# Patient Record
Sex: Female | Born: 1950
Health system: Southern US, Community
[De-identification: ages and names within clinical notes are randomized; demographics above are authoritative.]

## PROBLEM LIST (undated history)

## (undated) DIAGNOSIS — T8859XA Other complications of anesthesia, initial encounter: Secondary | ICD-10-CM

## (undated) DIAGNOSIS — C4492 Squamous cell carcinoma of skin, unspecified: Secondary | ICD-10-CM

## (undated) DIAGNOSIS — I82409 Acute embolism and thrombosis of unspecified deep veins of unspecified lower extremity: Secondary | ICD-10-CM

## (undated) DIAGNOSIS — M8580 Other specified disorders of bone density and structure, unspecified site: Secondary | ICD-10-CM

## (undated) DIAGNOSIS — R1900 Intra-abdominal and pelvic swelling, mass and lump, unspecified site: Secondary | ICD-10-CM

## (undated) DIAGNOSIS — I871 Compression of vein: Secondary | ICD-10-CM

## (undated) DIAGNOSIS — R112 Nausea with vomiting, unspecified: Secondary | ICD-10-CM

## (undated) DIAGNOSIS — I10 Essential (primary) hypertension: Secondary | ICD-10-CM

## (undated) DIAGNOSIS — M1990 Unspecified osteoarthritis, unspecified site: Secondary | ICD-10-CM

## (undated) DIAGNOSIS — E785 Hyperlipidemia, unspecified: Secondary | ICD-10-CM

## (undated) DIAGNOSIS — K219 Gastro-esophageal reflux disease without esophagitis: Secondary | ICD-10-CM

## (undated) HISTORY — DX: Squamous cell carcinoma of skin, unspecified: C44.92

## (undated) HISTORY — PX: BREAST ENHANCEMENT SURGERY: SHX7

## (undated) HISTORY — DX: Hyperlipidemia, unspecified: E78.5

## (undated) HISTORY — PX: BREAST BIOPSY: SHX20

## (undated) HISTORY — PX: AUGMENTATION MAMMAPLASTY: SUR837

## (undated) HISTORY — DX: Compression of vein: I87.1

## (undated) HISTORY — DX: Gastro-esophageal reflux disease without esophagitis: K21.9

## (undated) HISTORY — DX: Acute embolism and thrombosis of unspecified deep veins of unspecified lower extremity: I82.409

## (undated) HISTORY — DX: Other specified disorders of bone density and structure, unspecified site: M85.80

## (undated) HISTORY — PX: BREAST SURGERY: SHX581

## (undated) HISTORY — DX: Intra-abdominal and pelvic swelling, mass and lump, unspecified site: R19.00

---

## 1997-10-12 ENCOUNTER — Other Ambulatory Visit: Admission: RE | Admit: 1997-10-12 | Discharge: 1997-10-12 | Payer: Self-pay | Admitting: Obstetrics and Gynecology

## 1997-11-08 ENCOUNTER — Other Ambulatory Visit: Admission: RE | Admit: 1997-11-08 | Discharge: 1997-11-08 | Payer: Self-pay | Admitting: Obstetrics and Gynecology

## 1998-06-24 HISTORY — PX: LEEP: SHX91

## 1999-02-28 ENCOUNTER — Other Ambulatory Visit: Admission: RE | Admit: 1999-02-28 | Discharge: 1999-02-28 | Payer: Self-pay | Admitting: Obstetrics and Gynecology

## 1999-03-01 ENCOUNTER — Other Ambulatory Visit: Admission: RE | Admit: 1999-03-01 | Discharge: 1999-03-01 | Payer: Self-pay | Admitting: Obstetrics and Gynecology

## 1999-03-01 ENCOUNTER — Encounter (INDEPENDENT_AMBULATORY_CARE_PROVIDER_SITE_OTHER): Payer: Self-pay

## 2000-07-16 ENCOUNTER — Other Ambulatory Visit: Admission: RE | Admit: 2000-07-16 | Discharge: 2000-07-16 | Payer: Self-pay | Admitting: Obstetrics and Gynecology

## 2000-07-30 ENCOUNTER — Encounter: Payer: Self-pay | Admitting: Obstetrics and Gynecology

## 2000-07-30 ENCOUNTER — Encounter: Admission: RE | Admit: 2000-07-30 | Discharge: 2000-07-30 | Payer: Self-pay | Admitting: Obstetrics and Gynecology

## 2000-10-15 ENCOUNTER — Encounter: Admission: RE | Admit: 2000-10-15 | Discharge: 2000-10-15 | Payer: Self-pay | Admitting: Obstetrics and Gynecology

## 2000-10-15 ENCOUNTER — Encounter: Payer: Self-pay | Admitting: Obstetrics and Gynecology

## 2001-03-16 ENCOUNTER — Encounter: Payer: Self-pay | Admitting: Obstetrics and Gynecology

## 2001-03-16 ENCOUNTER — Encounter: Admission: RE | Admit: 2001-03-16 | Discharge: 2001-03-16 | Payer: Self-pay | Admitting: Obstetrics and Gynecology

## 2001-04-15 ENCOUNTER — Ambulatory Visit (HOSPITAL_BASED_OUTPATIENT_CLINIC_OR_DEPARTMENT_OTHER): Admission: RE | Admit: 2001-04-15 | Discharge: 2001-04-15 | Payer: Self-pay | Admitting: *Deleted

## 2001-04-15 ENCOUNTER — Encounter (INDEPENDENT_AMBULATORY_CARE_PROVIDER_SITE_OTHER): Payer: Self-pay | Admitting: *Deleted

## 2001-09-02 ENCOUNTER — Other Ambulatory Visit: Admission: RE | Admit: 2001-09-02 | Discharge: 2001-09-02 | Payer: Self-pay | Admitting: Obstetrics and Gynecology

## 2002-12-06 ENCOUNTER — Other Ambulatory Visit: Admission: RE | Admit: 2002-12-06 | Discharge: 2002-12-06 | Payer: Self-pay | Admitting: Obstetrics and Gynecology

## 2004-01-09 ENCOUNTER — Encounter: Admission: RE | Admit: 2004-01-09 | Discharge: 2004-01-09 | Payer: Self-pay | Admitting: Obstetrics and Gynecology

## 2004-02-01 ENCOUNTER — Other Ambulatory Visit: Admission: RE | Admit: 2004-02-01 | Discharge: 2004-02-01 | Payer: Self-pay | Admitting: Obstetrics and Gynecology

## 2008-10-13 ENCOUNTER — Encounter: Admission: RE | Admit: 2008-10-13 | Discharge: 2008-10-13 | Payer: Self-pay | Admitting: Obstetrics and Gynecology

## 2008-11-28 ENCOUNTER — Other Ambulatory Visit: Admission: RE | Admit: 2008-11-28 | Discharge: 2008-11-28 | Payer: Self-pay | Admitting: Family Medicine

## 2008-12-05 ENCOUNTER — Encounter: Admission: RE | Admit: 2008-12-05 | Discharge: 2008-12-05 | Payer: Self-pay | Admitting: Family Medicine

## 2009-11-17 ENCOUNTER — Encounter: Admission: RE | Admit: 2009-11-17 | Discharge: 2009-11-17 | Payer: Self-pay | Admitting: Family Medicine

## 2010-01-08 ENCOUNTER — Other Ambulatory Visit: Admission: RE | Admit: 2010-01-08 | Discharge: 2010-01-08 | Payer: Self-pay | Admitting: Family Medicine

## 2010-11-09 NOTE — Op Note (Signed)
[facility]  Patient:    Bethany Simon, Bethany Simon Visit Number: [id number] MRN: [id number]          Service Type: DSU Location: [facility] Attending Physician:  Vikki Ports. Dictated by:   Vikki Ports, M.D. Proc. Date: 04/15/01 Admit Date:  04/15/2001                             Operative Report  PREOPERATIVE DIAGNOSIS:  Right breast mass and bilateral breast implants.  POSTOPERATIVE DIAGNOSIS:  Right breast mass and bilateral breast implants.  PROCEDURE:  Excisional right breast biopsy.  ANESTHESIA:  General.  SURGEON:  Vikki Ports, M.D.  DESCRIPTION OF PROCEDURE:  The patient was taken to the operating room and placed in a supine position.  After adequate general anesthesia was induced, the patient was being mask-ventilated.  The right breast was prepped and draped in the normal sterile fashion.  Using 1-2 cc of 1% lidocaine local anesthesia overlying the mass in the 9 oclock region of the right breast, the skin was anesthetized.  The remainder of the dissection was performed using electrocautery as not to injure the implant.  The mass was delivered up and through the wound, excised in its entirety.  Adequate hemostasis was ensured using Bovie electrocautery, and the skin was closed with subcuticular 4-0 Monocryl.  Steri-Strips, sterile dressing was applied.  The patient tolerated the procedure well, went to PACU in good condition. Dictated by:   Vikki Ports, M.D. Attending Physician:  Danna Hefty R. DD:  04/15/01 TD:  04/16/01 Job: [id number]

## 2011-10-03 ENCOUNTER — Other Ambulatory Visit: Payer: Self-pay | Admitting: Family Medicine

## 2011-10-03 DIAGNOSIS — Z1231 Encounter for screening mammogram for malignant neoplasm of breast: Secondary | ICD-10-CM

## 2011-11-07 ENCOUNTER — Ambulatory Visit
Admission: RE | Admit: 2011-11-07 | Discharge: 2011-11-07 | Disposition: A | Discharge status: Home / Self Care | Payer: BC Managed Care – PPO | Source: Ambulatory Visit | Attending: Family Medicine | Admitting: Family Medicine

## 2011-11-07 DIAGNOSIS — Z1231 Encounter for screening mammogram for malignant neoplasm of breast: Secondary | ICD-10-CM

## 2012-02-12 ENCOUNTER — Other Ambulatory Visit: Payer: Self-pay | Admitting: Family Medicine

## 2012-02-12 ENCOUNTER — Other Ambulatory Visit (HOSPITAL_COMMUNITY)
Admission: RE | Admit: 2012-02-12 | Discharge: 2012-02-12 | Disposition: A | Discharge status: Home / Self Care | Payer: BC Managed Care – PPO | Source: Ambulatory Visit | Attending: Family Medicine | Admitting: Family Medicine

## 2012-02-12 DIAGNOSIS — Z0000 Encounter for general adult medical examination without abnormal findings: Secondary | ICD-10-CM | POA: Insufficient documentation

## 2014-06-24 HISTORY — PX: CATARACT EXTRACTION: SUR2

## 2014-12-08 ENCOUNTER — Other Ambulatory Visit: Payer: Self-pay

## 2014-12-08 DIAGNOSIS — Z1231 Encounter for screening mammogram for malignant neoplasm of breast: Secondary | ICD-10-CM

## 2014-12-20 ENCOUNTER — Ambulatory Visit
Admission: RE | Admit: 2014-12-20 | Discharge: 2014-12-20 | Disposition: A | Discharge status: Home / Self Care | Payer: BC Managed Care – PPO | Source: Ambulatory Visit

## 2014-12-20 DIAGNOSIS — Z1231 Encounter for screening mammogram for malignant neoplasm of breast: Secondary | ICD-10-CM

## 2015-05-29 ENCOUNTER — Other Ambulatory Visit (HOSPITAL_COMMUNITY)
Admission: RE | Admit: 2015-05-29 | Discharge: 2015-05-29 | Disposition: A | Discharge status: Home / Self Care | Payer: BC Managed Care – PPO | Source: Ambulatory Visit | Attending: Family Medicine | Admitting: Family Medicine

## 2015-05-29 ENCOUNTER — Other Ambulatory Visit: Payer: Self-pay | Admitting: Family Medicine

## 2015-05-29 DIAGNOSIS — Z124 Encounter for screening for malignant neoplasm of cervix: Secondary | ICD-10-CM | POA: Insufficient documentation

## 2015-05-31 LAB — CYTOLOGY - PAP

## 2018-01-02 ENCOUNTER — Other Ambulatory Visit: Payer: Self-pay | Admitting: Family Medicine

## 2018-01-02 DIAGNOSIS — Z1231 Encounter for screening mammogram for malignant neoplasm of breast: Secondary | ICD-10-CM

## 2018-01-29 ENCOUNTER — Ambulatory Visit
Admission: RE | Admit: 2018-01-29 | Discharge: 2018-01-29 | Disposition: A | Discharge status: Home / Self Care | Payer: BC Managed Care – PPO | Source: Ambulatory Visit | Attending: Family Medicine | Admitting: Family Medicine

## 2018-01-29 ENCOUNTER — Encounter: Payer: Self-pay | Admitting: Radiology

## 2018-01-29 DIAGNOSIS — Z1231 Encounter for screening mammogram for malignant neoplasm of breast: Secondary | ICD-10-CM

## 2018-04-21 ENCOUNTER — Other Ambulatory Visit: Payer: Self-pay | Admitting: Family Medicine

## 2018-04-21 DIAGNOSIS — E2839 Other primary ovarian failure: Secondary | ICD-10-CM

## 2018-04-30 ENCOUNTER — Ambulatory Visit
Admission: RE | Admit: 2018-04-30 | Discharge: 2018-04-30 | Disposition: A | Discharge status: Home / Self Care | Payer: Medicare Other | Source: Ambulatory Visit | Attending: Family Medicine | Admitting: Family Medicine

## 2018-04-30 DIAGNOSIS — E2839 Other primary ovarian failure: Secondary | ICD-10-CM

## 2019-07-16 ENCOUNTER — Ambulatory Visit: Payer: Medicare PPO | Attending: Internal Medicine

## 2019-07-16 DIAGNOSIS — Z23 Encounter for immunization: Secondary | ICD-10-CM | POA: Insufficient documentation

## 2019-07-16 NOTE — Progress Notes (Signed)
   Covid-19 Vaccination Clinic  Name:  Bethany Simon    MRN: [id number] DOB: January 23, 1951  07/16/2019  Bethany Simon was observed post Covid-19 immunization for 15 minutes without incidence. She was provided with Vaccine Information Sheet and instruction to access the V-Safe system.   Bethany Simon was instructed to call 911 with any severe reactions post vaccine: Marland Kitchen Difficulty breathing  . Swelling of your face and throat  . A fast heartbeat  . A bad rash all over your body  . Dizziness and weakness    Immunizations Administered    Name Date Dose VIS Date Route   Pfizer COVID-19 Vaccine 07/16/2019  4:44 PM 0.3 mL 06/04/2019 Intramuscular   Manufacturer: [place]   Lot: [id number]   [place]: [id number]

## 2019-07-29 ENCOUNTER — Ambulatory Visit: Payer: Medicare Other

## 2019-08-06 ENCOUNTER — Ambulatory Visit: Payer: Medicare PPO | Attending: Internal Medicine

## 2019-08-06 DIAGNOSIS — Z23 Encounter for immunization: Secondary | ICD-10-CM | POA: Insufficient documentation

## 2019-08-06 NOTE — Progress Notes (Signed)
   Covid-19 Vaccination Clinic  Name:  Bethany Simon    MRN: [id number] DOB: 1951/01/27  08/06/2019  [name] was observed post Covid-19 immunization for 15 minutes without incidence. She was provided with Vaccine Information Sheet and instruction to access the V-Safe system.   [name] was instructed to call 911 with any severe reactions post vaccine: Marland Kitchen Difficulty breathing  . Swelling of your face and throat  . A fast heartbeat  . A bad rash all over your body  . Dizziness and weakness    Immunizations Administered    Name Date Dose VIS Date Route   Pfizer COVID-19 Vaccine 08/06/2019  9:24 AM 0.3 mL 06/04/2019 Intramuscular   Manufacturer: [place]   Lot: [id number]   [place]: [id number]

## 2019-10-07 ENCOUNTER — Emergency Department (HOSPITAL_COMMUNITY): Payer: Medicare PPO

## 2019-10-07 ENCOUNTER — Encounter (HOSPITAL_COMMUNITY): Payer: Self-pay | Admitting: Emergency Medicine

## 2019-10-07 ENCOUNTER — Other Ambulatory Visit: Payer: Self-pay

## 2019-10-07 ENCOUNTER — Emergency Department (HOSPITAL_COMMUNITY)
Admission: EM | Admit: 2019-10-07 | Discharge: 2019-10-07 | Disposition: A | Discharge status: Home / Self Care | Payer: Medicare PPO | Attending: Emergency Medicine | Admitting: Emergency Medicine

## 2019-10-07 ENCOUNTER — Emergency Department (HOSPITAL_BASED_OUTPATIENT_CLINIC_OR_DEPARTMENT_OTHER)
Admit: 2019-10-07 | Discharge: 2019-10-07 | Disposition: A | Discharge status: Home / Self Care | Payer: Medicare PPO | Attending: Emergency Medicine | Admitting: Emergency Medicine

## 2019-10-07 DIAGNOSIS — R609 Edema, unspecified: Secondary | ICD-10-CM | POA: Diagnosis not present

## 2019-10-07 DIAGNOSIS — R2242 Localized swelling, mass and lump, left lower limb: Secondary | ICD-10-CM | POA: Diagnosis not present

## 2019-10-07 DIAGNOSIS — Z79899 Other long term (current) drug therapy: Secondary | ICD-10-CM | POA: Insufficient documentation

## 2019-10-07 DIAGNOSIS — I82412 Acute embolism and thrombosis of left femoral vein: Secondary | ICD-10-CM | POA: Diagnosis not present

## 2019-10-07 DIAGNOSIS — M5136 Other intervertebral disc degeneration, lumbar region: Secondary | ICD-10-CM | POA: Insufficient documentation

## 2019-10-07 DIAGNOSIS — Z87891 Personal history of nicotine dependence: Secondary | ICD-10-CM | POA: Insufficient documentation

## 2019-10-07 DIAGNOSIS — M79662 Pain in left lower leg: Secondary | ICD-10-CM | POA: Diagnosis present

## 2019-10-07 LAB — CK: Total CK: 42 U/L (ref 38–234)

## 2019-10-07 LAB — CBC WITH DIFFERENTIAL/PLATELET
Abs Immature Granulocytes: 0.03 10*3/uL (ref 0.00–0.07)
Basophils Absolute: 0 10*3/uL (ref 0.0–0.1)
Basophils Relative: 0 %
Eosinophils Absolute: 0.1 10*3/uL (ref 0.0–0.5)
Eosinophils Relative: 1 %
HCT: 42.7 % (ref 36.0–46.0)
Hemoglobin: 13.3 g/dL (ref 12.0–15.0)
Immature Granulocytes: 0 %
Lymphocytes Relative: 8 %
Lymphs Abs: 0.7 10*3/uL (ref 0.7–4.0)
MCH: 29.8 pg (ref 26.0–34.0)
MCHC: 31.1 g/dL (ref 30.0–36.0)
MCV: 95.5 fL (ref 80.0–100.0)
Monocytes Absolute: 0.6 10*3/uL (ref 0.1–1.0)
Monocytes Relative: 7 %
Neutro Abs: 7.8 10*3/uL — ABNORMAL HIGH (ref 1.7–7.7)
Neutrophils Relative %: 84 %
Platelets: 164 10*3/uL (ref 150–400)
RBC: 4.47 MIL/uL (ref 3.87–5.11)
RDW: 13.5 % (ref 11.5–15.5)
WBC: 9.3 10*3/uL (ref 4.0–10.5)
nRBC: 0 % (ref 0.0–0.2)

## 2019-10-07 LAB — COMPREHENSIVE METABOLIC PANEL
ALT: 14 U/L (ref 0–44)
AST: 18 U/L (ref 15–41)
Albumin: 4.5 g/dL (ref 3.5–5.0)
Alkaline Phosphatase: 80 U/L (ref 38–126)
Anion gap: 10 (ref 5–15)
BUN: 23 mg/dL (ref 8–23)
CO2: 27 mmol/L (ref 22–32)
Calcium: 9.6 mg/dL (ref 8.9–10.3)
Chloride: 105 mmol/L (ref 98–111)
Creatinine, Ser: 0.81 mg/dL (ref 0.44–1.00)
GFR calc Af Amer: >60 mL/min (ref >60)
GFR calc non Af Amer: >60 mL/min (ref >60)
Glucose, Bld: 128 mg/dL — ABNORMAL HIGH (ref 70–99)
Potassium: 4.2 mmol/L (ref 3.5–5.1)
Sodium: 142 mmol/L (ref 135–145)
Total Bilirubin: 1.1 mg/dL (ref 0.3–1.2)
Total Protein: 7.7 g/dL (ref 6.5–8.1)

## 2019-10-07 MED ORDER — APIXABAN 5 MG PO TABS: ORAL_TABLET | ORAL | 0 refills | Status: DC

## 2019-10-07 MED ORDER — APIXABAN (ELIQUIS) EDUCATION KIT FOR DVT/PE PATIENTS
PACK | Freq: Once | Status: AC
  Filled 2019-10-07: qty 1

## 2019-10-07 MED ORDER — APIXABAN 5 MG PO TABS
10.0000 mg | ORAL_TABLET | Freq: Once | ORAL | Status: AC
  Administered 2019-10-07: 10 mg via ORAL
  Filled 2019-10-07: qty 2

## 2019-10-07 NOTE — ED Triage Notes (Signed)
Pt had what she thought was sciatica on left side. Yesterday acute onset redness and swelling of left leg. Denies recent long travel; denies CP/SOB.

## 2019-10-07 NOTE — Progress Notes (Signed)
ANTICOAGULATION CONSULT NOTE - Initial Consult  Pharmacy Consult for Apixaban Indication: DVT  Allergies  Allergen Reactions  . Atropine     convulsion    Patient Measurements: Height: 5\' 7"  (170.2 cm) Weight: 95.3 kg (210 lb) IBW/kg (Calculated) : 61.6  Vital Signs: Temp: 97.4 F (36.3 C) (04/15 1015) Temp Source: Oral (04/15 1015) BP: 151/58 (04/15 1230) Pulse Rate: 76 (04/15 1230)  Labs: Recent Labs    10/07/19 1132  HGB 13.3  HCT 42.7  PLT 164  CREATININE 0.81  CKTOTAL 42    Estimated Creatinine Clearance: 77.7 mL/min (by C-G formula based on SCr of 0.81 mg/dL).   Medical History: History reviewed. No pertinent past medical history.  Medications:  Scheduled:  . apixaban   Does not apply Once  . apixaban  10 mg Oral Once   Infusions:   PRN:   Assessment: 69 yo female with new diagnosis of DVT.  Pharmacy consulted to begin Eliquis therapy.  CBC and renal function wnl.  No anticoagulants PTA but does report ibuprofen use.  Goal of Therapy:  Therapeutic anticoagulation   Plan:  Apixaban 10mg  PO bid x 7 days, then 5mg  bid therafter Provided 30-day coupon card Educated patient and daughter regarding [place] therapy - pt counseled to avoid ibuprofen use with apixaban  Peggyann Juba, PharmD, BCPS Pharmacy: [phone] 10/07/2019,1:32 PM

## 2019-10-07 NOTE — Discharge Instructions (Addendum)
   Information on my medicine - ELIQUIS (apixaban)  This medication education was reviewed with me or my healthcare representative as part of my discharge preparation.  The pharmacist that spoke with me during my hospital stay was:  Emiliano Dyer, [facility]  Why was Eliquis prescribed for you? Eliquis was prescribed to treat blood clots that may have been found in the veins of your legs (deep vein thrombosis) or in your lungs (pulmonary embolism) and to reduce the risk of them occurring again.  What do You need to know about Eliquis ? The starting dose is 10 mg (two 5 mg tablets) taken TWICE daily for the FIRST SEVEN (7) DAYS, then on (enter date)  10/14/2019  the dose is reduced to ONE 5 mg tablet taken TWICE daily.  Eliquis may be taken with or without food.   Try to take the dose about the same time in the morning and in the evening. If you have difficulty swallowing the tablet whole please discuss with your pharmacist how to take the medication safely.  Take Eliquis exactly as prescribed and DO NOT stop taking Eliquis without talking to the doctor who prescribed the medication.  Stopping may increase your risk of developing a new blood clot.  Refill your prescription before you run out.  After discharge, you should have regular check-up appointments with your healthcare provider that is prescribing your Eliquis.    What do you do if you miss a dose? If a dose of ELIQUIS is not taken at the scheduled time, take it as soon as possible on the same day and twice-daily administration should be resumed. The dose should not be doubled to make up for a missed dose.  Important Safety Information A possible side effect of Eliquis is bleeding. You should call your healthcare provider right away if you experience any of the following: Bleeding from an injury or your nose that does not stop. Unusual colored urine (red or dark brown) or unusual colored stools (red or black). Unusual bruising  for unknown reasons. A serious fall or if you hit your head (even if there is no bleeding).  Some medicines may interact with Eliquis and might increase your risk of bleeding or clotting while on Eliquis. To help avoid this, consult your healthcare provider or pharmacist prior to using any new prescription or non-prescription medications, including herbals, vitamins, non-steroidal anti-inflammatory drugs (NSAIDs) and supplements.  This website has more information on Eliquis (apixaban): http://www.eliquis.com/eliquis/home

## 2019-10-07 NOTE — Progress Notes (Signed)
Lower extremity venous has been completed.   Preliminary results in CV Proc.   Abram Sander 10/07/2019 11:59 AM

## 2019-10-07 NOTE — ED Notes (Signed)
Pharmacist en route to education pt on eliquis, then first dose to be administered.

## 2019-10-07 NOTE — ED Provider Notes (Signed)
[facility] DEPT Provider Note   CSN: [id number] Arrival date & time: 10/07/19  1000     History Chief Complaint  Patient presents with  . Leg Pain  . Leg Swelling    Bethany Simon is a 69 y.o. female.  HPI    69 year old female comes in a chief complaint of leg pain and swelling.  Patient reports that about the last week or so she has been having back pain that is radiating down towards her hip area.  She started noticing some leg discomfort and swelling yesterday that has progressed.  She is having pain over her calf region with walking.   Pt has no hx of PE, DVT and denies any exogenous hormone (testosterone / estrogen) use, long distance travels or surgery in the past 6 weeks, active cancer, recent immobilization.  Patient denies any COVID-19 infection or recent immunization.  She was vaccinated back in January.   History reviewed. No pertinent past medical history.  There are no problems to display for this patient.   History reviewed. No pertinent surgical history.   OB History   No obstetric history on file.     No family history on file.  Social History   Tobacco Use  . Smoking status: Former Smoker  Substance Use Topics  . Alcohol use: Yes    Comment: social  . Drug use: Never    Home Medications Prior to Admission medications   Medication Sig Start Date End Date Taking? Authorizing Provider  aspirin 325 MG tablet Take 650 mg by mouth every 4 (four) hours as needed for mild pain.   Yes [provider]  atorvastatin (LIPITOR) 20 MG tablet Take 20 mg by mouth daily. 09/30/19  Yes [provider]  ibuprofen (ADVIL) 200 MG tablet Take 200 mg by mouth every 6 (six) hours as needed for headache or mild pain.   Yes [provider]  lisinopril (ZESTRIL) 10 MG tablet Take 10 mg by mouth daily. 08/01/19  Yes [provider]  RESTASIS 0.05 % ophthalmic emulsion Place 1 drop into both eyes 2 (two)  times daily. 08/30/19  Yes [provider]    Allergies    Atropine  Review of Systems   Review of Systems  Constitutional: Positive for activity change.  Respiratory: Negative for shortness of breath.   Cardiovascular: Negative for chest pain.  Gastrointestinal: Negative for nausea and vomiting.  Musculoskeletal: Positive for arthralgias and myalgias.  Skin: Positive for rash.  Neurological: Negative for dizziness and syncope.  All other systems reviewed and are negative.   Physical Exam Updated Vital Signs BP (!) 151/58   Pulse 76   Temp (!) 97.4 F (36.3 C) (Oral)   Resp 16   Ht '5\' 7"'$  (1.702 m)   Wt 95.3 kg   SpO2 95%   BMI 32.89 kg/m   Physical Exam Vitals and nursing note reviewed.  Constitutional:      Appearance: She is well-developed.  HENT:     Head: Normocephalic and atraumatic.  Cardiovascular:     Rate and Rhythm: Normal rate.  Pulmonary:     Effort: Pulmonary effort is normal.  Abdominal:     General: Bowel sounds are normal.  Musculoskeletal:        General: Swelling and tenderness present.     Cervical back: Normal range of motion and neck supple.     Left lower leg: Edema present.     Comments: Patient has unilateral left lower  extremity edema with erythema and warmth to touch.  2+ dorsalis pedis. She has calf tenderness.  Skin:    General: Skin is warm and dry.     Findings: Rash present.  Neurological:     Mental Status: She is alert and oriented to person, place, and time.     ED Results / Procedures / Treatments   Labs (all labs ordered are listed, but only abnormal results are displayed) Labs Reviewed  COMPREHENSIVE METABOLIC PANEL - Abnormal; Notable for the following components:      Result Value   Glucose, Bld 128 (*)    All other components within normal limits  CBC WITH DIFFERENTIAL/PLATELET - Abnormal; Notable for the following components:   Neutro Abs 7.8 (*)    All other components within normal limits  CK     EKG None  Radiology DG Lumbar Spine Complete  Result Date: 10/07/2019 CLINICAL DATA:  Low back pain EXAM: LUMBAR SPINE - COMPLETE 4+ VIEW COMPARISON:  None. FINDINGS: Frontal, lateral, spot lumbosacral lateral, and bilateral oblique views were obtained. There are 5 non-rib-bearing lumbar type vertebral bodies. There is no fracture or spondylolisthesis. There is moderate disc space narrowing at L4-5. Other disc spaces appear unremarkable. There is facet osteoarthritic change at L5-S1 bilaterally. There are foci of aortic atherosclerosis. IMPRESSION: Moderate disc space narrowing at L4-5. Other disc spaces appear unremarkable. Facet osteoarthritic change noted at L5-S1 bilaterally. No fracture or spondylolisthesis. Aortic Atherosclerosis (ICD10-I70.0). Electronically Signed   By: Lowella Grip III M.D.   On: 10/07/2019 13:14   VAS [place] LOWER EXTREMITY VENOUS (DVT) (ONLY [facility] & [facility])  Result Date: 10/07/2019  Lower Venous DVTStudy Indications: Edema.  Comparison Study: no prior Performing Technologist: Abram Sander RVS  Examination Guidelines: A complete evaluation includes B-mode imaging, spectral Doppler, color Doppler, and power Doppler as needed of all accessible portions of each vessel. Bilateral testing is considered an integral part of a complete examination. Limited examinations for reoccurring indications may be performed as noted. The reflux portion of the exam is performed with the patient in reverse Trendelenburg.  +---------+---------------+---------+-----------+----------+--------------+ LEFT     CompressibilityPhasicitySpontaneityPropertiesThrombus Aging +---------+---------------+---------+-----------+----------+--------------+ CFV      None           No       No                                  +---------+---------------+---------+-----------+----------+--------------+ SFJ      None                                                         +---------+---------------+---------+-----------+----------+--------------+ FV Prox  None                                                        +---------+---------------+---------+-----------+----------+--------------+ FV Mid   None                                                        +---------+---------------+---------+-----------+----------+--------------+  FV DistalNone                                                        +---------+---------------+---------+-----------+----------+--------------+ PFV      None                                                        +---------+---------------+---------+-----------+----------+--------------+ POP      None           No       No                                  +---------+---------------+---------+-----------+----------+--------------+ PTV      None                                                        +---------+---------------+---------+-----------+----------+--------------+ PERO     None                                                        +---------+---------------+---------+-----------+----------+--------------+ iiv                                                   Not visualized +---------+---------------+---------+-----------+----------+--------------+ eiv                                                   Not visualized +---------+---------------+---------+-----------+----------+--------------+     Summary: LEFT: - Findings consistent with acute deep vein thrombosis involving the left common femoral vein, SF junction, left femoral vein, left proximal profunda vein, left posterior tibial veins, left popliteal vein, and left peroneal veins. - No cystic structure found in the popliteal fossa.  *See table(s) above for measurements and observations.    Preliminary     Procedures Procedures (including critical care time)  Medications Ordered in ED Medications  apixaban [facility]) Education  Kit for DVT/PE patients (has no administration in time range)  apixaban (ELIQUIS) tablet 10 mg (has no administration in time range)    ED Course  I have reviewed the triage vital signs and the nursing notes.  Pertinent labs & imaging results that were available during my care of the patient were reviewed by me and considered in my medical decision making (see chart for details).    MDM Rules/Calculators/A&P                      69 year old comes in a chief complaint of unilateral left lower  extremity swelling and pain in her back that radiates to her hip.  She also has calf tenderness.  No DVT or PE risk factors. Ultrasound DVT ordered given the differential diagnosis includes #1 DVT, #2 cellulitis, #3 myositis.  X-ray of the spine ordered to ensure there was no evidence of severe arthritis that can lead to impingement syndrome.  X-ray does show arthritis.  Ultrasound is positive for extensive DVT.  Case discussed with Dr. Kasandra Knudsen.  Patient is stable and neurovascularly intact.  We will start patient on Eliquis and Dr. Kasandra Knudsen, vascular surgery will follow up with patient in 1 week.   The patient appears reasonably screened and/or stabilized for discharge and I doubt any other medical condition or other [facility] requiring further screening, evaluation, or treatment in the ED at this time prior to discharge.   Results from the ER workup discussed with the patient face to face and all questions answered to the best of my ability. The patient is safe for discharge with strict return precautions.  Final Clinical Impression(s) / ED Diagnoses Final diagnoses:  Acute deep vein thrombosis (DVT) of femoral vein of left lower extremity (HCC)  Degenerative disc disease, lumbar    Rx / DC Orders ED Discharge Orders    None       Varney Biles, MD 10/07/19 1331

## 2019-10-15 ENCOUNTER — Encounter: Payer: Self-pay | Admitting: Vascular Surgery

## 2019-10-15 ENCOUNTER — Other Ambulatory Visit: Payer: Self-pay

## 2019-10-15 ENCOUNTER — Ambulatory Visit: Payer: Medicare PPO | Admitting: Vascular Surgery

## 2019-10-15 VITALS — BP 138/80 | HR 90 | Temp 97.9°F | Resp 20 | Ht 67.0 in | Wt 216.0 lb

## 2019-10-15 DIAGNOSIS — I82412 Acute embolism and thrombosis of left femoral vein: Secondary | ICD-10-CM | POA: Diagnosis not present

## 2019-10-15 NOTE — Progress Notes (Signed)
Patient ID: Bethany Simon, female   DOB: Aug 15, 1950, 69 y.o.   MRN: [id number]  Reason for Consult: Follow-up   Referred by Harlan Stains, MD  Subjective:     HPI:  Bethany Simon is a 69 y.o. female recently found to have extensive left lower extremity DVT.  She has no history or family history of DVT.  She does not take any estrogen therapy.  States pain was initially in her groin that led her to evaluation although she does have chronic ongoing back pain thought that she may have had sciatica a few months ago extending in her left buttock.  She does have some persistent left lower extremity swelling with some discomfort overall is progressing well on Eliquis.  She does not take any other blood thinners or antiplatelet agents at this time.  She has no skin changes.  Past Medical History:  Diagnosis Date  . DVT (deep venous thrombosis) ([place])   . Hyperlipidemia    No family history on file. Past Surgical History:  Procedure Laterality Date  . BREAST ENHANCEMENT SURGERY      Short Social History:  Social History   Tobacco Use  . Smoking status: Former Research scientist (life sciences)  . Smokeless tobacco: Never Used  Substance Use Topics  . Alcohol use: Yes    Comment: social    Allergies  Allergen Reactions  . Atropine     convulsion    Current Outpatient Medications  Medication Sig Dispense Refill  . apixaban (ELIQUIS) 5 MG TABS tablet Take 2 tablets (10mg ) twice daily for 7 days, then 1 tablet (5mg ) twice daily 60 tablet 0  . atorvastatin (LIPITOR) 20 MG tablet Take 20 mg by mouth daily.    Marland Kitchen lisinopril (ZESTRIL) 10 MG tablet Take 10 mg by mouth daily.    . RESTASIS 0.05 % ophthalmic emulsion Place 1 drop into both eyes 2 (two) times daily.    Marland Kitchen ibuprofen (ADVIL) 200 MG tablet Take 200 mg by mouth every 6 (six) hours as needed for headache or mild pain.     No current facility-administered medications for this visit.    Review of Systems  Constitutional:  Constitutional  negative. HENT: HENT negative.  Eyes: Eyes negative.  Respiratory: Respiratory negative.  Cardiovascular: Positive for leg swelling.  GI: Gastrointestinal negative.  Musculoskeletal: Musculoskeletal negative.  Skin: Skin negative.  Neurological: Neurological negative. Hematologic: Hematologic/lymphatic negative.  Psychiatric: Psychiatric negative.        Objective:   Vitals:   10/15/19 0827  BP: 138/80  Pulse: 90  Resp: 20  Temp: 97.9 F (36.6 C)  SpO2: 97%     Physical Exam Constitutional:      Appearance: Normal appearance.  HENT:     Nose: Nose normal.     Mouth/Throat:     Mouth: Mucous membranes are moist.  Eyes:     Pupils: Pupils are equal, round, and reactive to light.  Cardiovascular:     Rate and Rhythm: Normal rate and regular rhythm.  Pulmonary:     Effort: Pulmonary effort is normal.  Abdominal:     General: Abdomen is flat.     Palpations: Abdomen is soft.  Musculoskeletal:     Cervical back: Normal range of motion and neck supple.     Left lower leg: Edema present.  Skin:    General: Skin is warm and dry.     Capillary Refill: Capillary refill takes less than 2 seconds.  Neurological:     General: No  focal deficit present.  Psychiatric:        Mood and Affect: Mood normal.        Behavior: Behavior normal.        Thought Content: Thought content normal.        Judgment: Judgment normal.     Data:  DVT duplex Summary:  LEFT:  - Findings consistent with acute deep vein thrombosis involving the left  common femoral vein, SF junction, left femoral vein, left proximal  profunda vein, left posterior tibial veins, left popliteal vein, and left  peroneal veins.  - No cystic structure found in the popliteal fossa.        Assessment/Plan:     69 year old female presents after having extensive left lower extremity DVT evaluated in the emergency department.  Given the extent I recommended at least 6 months of anticoagulation with Eliquis if  she can tolerate followed by indefinite aspirin and I have actually asked her to start baby aspirin today.  I have given her the options of medical management transitioning to aspirin indefinitely after anticoagulation.  Other options would be CT venogram which I think would be less useful than invasive venography.  Patient is going to discuss with her family and call to schedule.  She does not need any further duplexes at this time and can follow-up on an as-needed basis unless she is to schedule venogram.  I discussed the risk and benefits the risks of being injury to surrounding structures and need for lifelong ultrasound evaluation of a stent if this is placed.  Benefits being possibly resolving an occlusive process which could prevent future DVT when patient does stop Eliquis.  Unfortunate I do not think duplex would be a good study for this patient given her size and that this was unable to be evaluated at the time of the initial procedure.  She demonstrates very good understanding of the options and will call to schedule venography should she pursue that.   Procedure would be prone with venogram and intravascular ultrasound with plus or minus stenting and plus or minus thrombectomy.     [name] MD [facility]

## 2019-10-18 ENCOUNTER — Telehealth: Payer: Self-pay

## 2019-10-18 NOTE — Telephone Encounter (Signed)
Pt called and said that she would like to schedule the CT scan instead of a procedure. Advised that Jeani Hawking would call her tomorrow when scheduled.   York Cerise, CMA

## 2019-10-20 ENCOUNTER — Other Ambulatory Visit: Payer: Self-pay

## 2019-10-20 DIAGNOSIS — M79605 Pain in left leg: Secondary | ICD-10-CM

## 2019-11-23 ENCOUNTER — Ambulatory Visit
Admission: RE | Admit: 2019-11-23 | Discharge: 2019-11-23 | Disposition: A | Discharge status: Home / Self Care | Payer: Medicare PPO | Source: Ambulatory Visit | Attending: Vascular Surgery | Admitting: Vascular Surgery

## 2019-11-23 ENCOUNTER — Other Ambulatory Visit: Payer: Self-pay

## 2019-11-23 DIAGNOSIS — I722 Aneurysm of renal artery: Secondary | ICD-10-CM | POA: Diagnosis not present

## 2019-11-23 DIAGNOSIS — M79605 Pain in left leg: Secondary | ICD-10-CM

## 2019-11-23 MED ORDER — IOPAMIDOL (ISOVUE-300) INJECTION 61%
125.0000 mL | Freq: Once | INTRAVENOUS | Status: AC | PRN
  Administered 2019-11-23: 125 mL via INTRAVENOUS

## 2019-11-24 ENCOUNTER — Telehealth: Payer: Self-pay

## 2019-11-24 NOTE — Telephone Encounter (Signed)
Critical report form CT venogram abd/pelvis has been sent over. I have staff messaged Dr. Donzetta Matters to make him aware these results are in Epic. Pt is due to f/u with Dr. Donzetta Matters this Friday.

## 2019-11-26 ENCOUNTER — Other Ambulatory Visit: Payer: Self-pay

## 2019-11-26 ENCOUNTER — Ambulatory Visit (INDEPENDENT_AMBULATORY_CARE_PROVIDER_SITE_OTHER): Payer: Medicare PPO | Admitting: Vascular Surgery

## 2019-11-26 ENCOUNTER — Encounter: Payer: Self-pay | Admitting: Vascular Surgery

## 2019-11-26 VITALS — BP 138/83 | HR 81 | Temp 98.2°F | Resp 20 | Ht 67.0 in | Wt 211.5 lb

## 2019-11-26 DIAGNOSIS — I82412 Acute embolism and thrombosis of left femoral vein: Secondary | ICD-10-CM | POA: Diagnosis not present

## 2019-11-26 NOTE — Progress Notes (Signed)
Patient ID: Bethany Simon, female   DOB: Oct 15, 1950, 68 y.o.   MRN: [id number]  Reason for Consult: Follow-up   Referred by Harlan Stains, MD  Subjective:     HPI:  Bethany Simon is a 69 y.o. female and extensive left lower extremity DVT in April.  After further discussion we elected to undergo CT venogram.  She now follows up for further evaluation and review of her CT.  She does remain on Eliquis.  She denies any lower extremity swelling at this time.  Past Medical History:  Diagnosis Date  . DVT (deep venous thrombosis) ([place])   . Hyperlipidemia    History reviewed. No pertinent family history. Past Surgical History:  Procedure Laterality Date  . BREAST ENHANCEMENT SURGERY      Short Social History:  Social History   Tobacco Use  . Smoking status: Former Research scientist (life sciences)  . Smokeless tobacco: Never Used  Substance Use Topics  . Alcohol use: Yes    Comment: social    Allergies  Allergen Reactions  . Atropine     convulsion    Current Outpatient Medications  Medication Sig Dispense Refill  . apixaban (ELIQUIS) 5 MG TABS tablet Take 2 tablets (10mg ) twice daily for 7 days, then 1 tablet (5mg ) twice daily 60 tablet 0  . atorvastatin (LIPITOR) 20 MG tablet Take 20 mg by mouth daily.    Marland Kitchen lisinopril (ZESTRIL) 10 MG tablet Take 10 mg by mouth daily.    . RESTASIS 0.05 % ophthalmic emulsion Place 1 drop into both eyes 2 (two) times daily.     No current facility-administered medications for this visit.    Review of Systems  Constitutional:  Constitutional negative. HENT: HENT negative.  Eyes: Eyes negative.  Respiratory: Respiratory negative.  Cardiovascular: Cardiovascular negative.  GI: Gastrointestinal negative.  Musculoskeletal: Musculoskeletal negative.  Skin: Skin negative.  Neurological: Neurological negative. Hematologic: Hematologic/lymphatic negative.  Psychiatric: Psychiatric negative.        Objective:  Objective   Vitals:   11/26/19 0953  BP:  138/83  Pulse: 81  Resp: 20  Temp: 98.2 F (36.8 C)  SpO2: 97%  Weight: 211 lb 8 oz (95.9 kg)  Height: 5\' 7"  (1.702 m)   Body mass index is 33.13 kg/m.  Physical Exam HENT:     Head: Normocephalic.     Nose:     Comments: Mask in place Pulmonary:     Effort: Pulmonary effort is normal.  Abdominal:     General: Abdomen is flat.     Palpations: Abdomen is soft. There is no mass.  Musculoskeletal:        General: Normal range of motion.     Cervical back: Normal range of motion and neck supple.     Right lower leg: No edema.     Left lower leg: No edema.  Skin:    General: Skin is warm.     Capillary Refill: Capillary refill takes less than 2 seconds.  Neurological:     General: No focal deficit present.     Mental Status: She is alert.  Psychiatric:        Mood and Affect: Mood normal.        Thought Content: Thought content normal.        Judgment: Judgment normal.     Data: CT venogram IMPRESSION: 1. 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 Findings are suggestive for May-Thurner syndrome. The other abdominal and pelvic  venous structures are patent without evidence of acute deep venous thrombosis. 2. An incidental finding of potential clinical significance has been found. Unusual appearance of the uterus and right adnexa. Findings could be related to uterine fibroid disease but indeterminate. In addition, there are appears to be a right adnexal cyst which would be atypical for a patient of this age.  3. 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 4.  Aortic Atherosclerosis (ICD10-I70.0).      Assessment/Plan:     69 year old female with apparent May Thurner syndrome recent large left lower extremity DVT.  Swelling has essentially resolved.  She remains on Eliquis.  I discussed with her considering left lower extremity venography with intravascular ultrasound and stenting from a popliteal approach.  She is going to discuss with  her family.  This would be to prevent recurrent extensive DVT and post thrombotic syndrome.  She demonstrates good understanding.  We will schedule today she can call to cancel if she changes her mind.     [name] MD [facility]

## 2019-12-08 ENCOUNTER — Other Ambulatory Visit: Payer: Self-pay

## 2019-12-08 ENCOUNTER — Ambulatory Visit
Admission: RE | Admit: 2019-12-08 | Discharge: 2019-12-08 | Disposition: A | Discharge status: Home / Self Care | Payer: Medicare PPO | Source: Ambulatory Visit | Attending: Family Medicine | Admitting: Family Medicine

## 2019-12-08 ENCOUNTER — Other Ambulatory Visit: Payer: Self-pay | Admitting: Family Medicine

## 2019-12-08 DIAGNOSIS — R52 Pain, unspecified: Secondary | ICD-10-CM

## 2019-12-08 DIAGNOSIS — R1084 Generalized abdominal pain: Secondary | ICD-10-CM

## 2019-12-08 DIAGNOSIS — R109 Unspecified abdominal pain: Secondary | ICD-10-CM | POA: Diagnosis not present

## 2019-12-08 DIAGNOSIS — K5900 Constipation, unspecified: Secondary | ICD-10-CM | POA: Diagnosis not present

## 2019-12-08 DIAGNOSIS — I700 Atherosclerosis of aorta: Secondary | ICD-10-CM | POA: Diagnosis not present

## 2019-12-09 ENCOUNTER — Encounter (HOSPITAL_COMMUNITY): Payer: Self-pay

## 2019-12-09 ENCOUNTER — Emergency Department (HOSPITAL_COMMUNITY)
Admission: EM | Admit: 2019-12-09 | Discharge: 2019-12-10 | Disposition: A | Discharge status: Home / Self Care | Payer: Medicare PPO | Attending: Emergency Medicine | Admitting: Emergency Medicine

## 2019-12-09 ENCOUNTER — Other Ambulatory Visit: Payer: Self-pay

## 2019-12-09 DIAGNOSIS — K5909 Other constipation: Secondary | ICD-10-CM | POA: Diagnosis not present

## 2019-12-09 DIAGNOSIS — I82409 Acute embolism and thrombosis of unspecified deep veins of unspecified lower extremity: Secondary | ICD-10-CM | POA: Insufficient documentation

## 2019-12-09 DIAGNOSIS — E785 Hyperlipidemia, unspecified: Secondary | ICD-10-CM | POA: Insufficient documentation

## 2019-12-09 DIAGNOSIS — Z79899 Other long term (current) drug therapy: Secondary | ICD-10-CM | POA: Insufficient documentation

## 2019-12-09 DIAGNOSIS — I10 Essential (primary) hypertension: Secondary | ICD-10-CM | POA: Insufficient documentation

## 2019-12-09 DIAGNOSIS — Z87891 Personal history of nicotine dependence: Secondary | ICD-10-CM | POA: Insufficient documentation

## 2019-12-09 DIAGNOSIS — K5900 Constipation, unspecified: Secondary | ICD-10-CM | POA: Diagnosis not present

## 2019-12-09 HISTORY — DX: Essential (primary) hypertension: I10

## 2019-12-09 LAB — URINALYSIS, ROUTINE W REFLEX MICROSCOPIC
Bacteria, UA: NONE SEEN
Bilirubin Urine: NEGATIVE
Glucose, UA: NEGATIVE mg/dL
Hgb urine dipstick: NEGATIVE
Ketones, ur: 5 mg/dL — AB
Leukocytes,Ua: NEGATIVE
Nitrite: NEGATIVE
Protein, ur: 30 mg/dL — AB
Specific Gravity, Urine: 1.012 (ref 1.005–1.030)
pH: 5 (ref 5.0–8.0)

## 2019-12-09 LAB — COMPREHENSIVE METABOLIC PANEL
ALT: 13 U/L (ref 0–44)
AST: 15 U/L (ref 15–41)
Albumin: 3.8 g/dL (ref 3.5–5.0)
Alkaline Phosphatase: 88 U/L (ref 38–126)
Anion gap: 15 (ref 5–15)
BUN: 12 mg/dL (ref 8–23)
CO2: 24 mmol/L (ref 22–32)
Calcium: 9.2 mg/dL (ref 8.9–10.3)
Chloride: 102 mmol/L (ref 98–111)
Creatinine, Ser: 0.79 mg/dL (ref 0.44–1.00)
GFR calc Af Amer: >60 mL/min (ref >60)
GFR calc non Af Amer: >60 mL/min (ref >60)
Glucose, Bld: 137 mg/dL — ABNORMAL HIGH (ref 70–99)
Potassium: 4.1 mmol/L (ref 3.5–5.1)
Sodium: 141 mmol/L (ref 135–145)
Total Bilirubin: 0.7 mg/dL (ref 0.3–1.2)
Total Protein: 7.8 g/dL (ref 6.5–8.1)

## 2019-12-09 LAB — CBC
HCT: 36.9 % (ref 36.0–46.0)
Hemoglobin: 11.9 g/dL — ABNORMAL LOW (ref 12.0–15.0)
MCH: 30 pg (ref 26.0–34.0)
MCHC: 32.2 g/dL (ref 30.0–36.0)
MCV: 92.9 fL (ref 80.0–100.0)
Platelets: 396 10*3/uL (ref 150–400)
RBC: 3.97 MIL/uL (ref 3.87–5.11)
RDW: 12.7 % (ref 11.5–15.5)
WBC: 13.7 10*3/uL — ABNORMAL HIGH (ref 4.0–10.5)
nRBC: 0 % (ref 0.0–0.2)

## 2019-12-09 LAB — LIPASE, BLOOD: Lipase: 23 U/L (ref 11–51)

## 2019-12-09 MED ORDER — SORBITOL 70 % SOLN
960.0000 mL | TOPICAL_OIL | Freq: Once | ORAL | Status: AC
  Administered 2019-12-09: 960 mL via RECTAL
  Filled 2019-12-09 (×2): qty 473

## 2019-12-09 MED ORDER — SODIUM CHLORIDE 0.9% FLUSH: 3.0000 mL | Freq: Once | INTRAVENOUS | Status: DC

## 2019-12-09 MED ORDER — LACTULOSE 10 GM/15ML PO SOLN
30.0000 g | Freq: Once | ORAL | Status: AC
  Administered 2019-12-09: 30 g via ORAL
  Filled 2019-12-09: qty 60

## 2019-12-09 MED ORDER — SODIUM CHLORIDE 0.9 % IV BOLUS
1000.0000 mL | Freq: Once | INTRAVENOUS | Status: AC
  Administered 2019-12-09: 1000 mL via INTRAVENOUS

## 2019-12-09 NOTE — ED Notes (Signed)
Pharmacy called regarding SMOG enema ordered.

## 2019-12-09 NOTE — ED Provider Notes (Signed)
[facility] DEPT Provider Note   CSN: [id number] Arrival date & time: 12/09/19  1740     History Chief Complaint  Patient presents with  . Constipation    Bethany Simon is a 69 y.o. female hx of DVT, HTN, HL, here presenting with constipation.  Patient states that she is constipated for the last week or so.  She states that she has a history of constipation but usually not this bad. Patient states that she tried MiraLAX as well as stool softeners and enemas with no relief.  She feels nauseated but has no vomiting. She has no previous abdominal surgeries and has no history of bowel obstructions.  Patient had x-ray yesterday at the office that showed no SBO.  The history is provided by the patient.       Past Medical History:  Diagnosis Date  . DVT (deep venous thrombosis) ([place])   . Hyperlipidemia   . Hypertension     There are no problems to display for this patient.   Past Surgical History:  Procedure Laterality Date  . BREAST ENHANCEMENT SURGERY    . BREAST SURGERY       OB History   No obstetric history on file.     Family History  Problem Relation Age of Onset  . Cancer Mother   . Hypertension Mother   . Hypertension Father     Social History   Tobacco Use  . Smoking status: Former Research scientist (life sciences)  . Smokeless tobacco: Never Used  Vaping Use  . Vaping Use: Never used  Substance Use Topics  . Alcohol use: Yes    Comment: social  . Drug use: Never    Home Medications Prior to Admission medications   Medication Sig Start Date End Date Taking? Authorizing Provider  acetaminophen (TYLENOL) 500 MG tablet Take 1,000 mg by mouth every 8 (eight) hours as needed for moderate pain.   Yes [provider]  apixaban (ELIQUIS) 5 MG TABS tablet Take 2 tablets (10mg ) twice daily for 7 days, then 1 tablet (5mg ) twice daily Patient taking differently: Take 5 mg by mouth 2 (two) times daily.  10/07/19  Yes Varney Biles, MD    atorvastatin (LIPITOR) 20 MG tablet Take 20 mg by mouth every evening.  09/30/19  Yes [provider]  lisinopril (ZESTRIL) 10 MG tablet Take 10 mg by mouth daily. 08/01/19  Yes [provider]  RESTASIS 0.05 % ophthalmic emulsion Place 1 drop into both eyes 2 (two) times daily. 08/30/19  Yes [provider]    Allergies    Atropine  Review of Systems   Review of Systems  Gastrointestinal: Positive for constipation.  All other systems reviewed and are negative.   Physical Exam Updated Vital Signs BP (!) 141/82   Pulse 90   Temp 98.2 F (36.8 C) (Oral)   Resp 18   Ht 5\' 7"  (1.702 m)   Wt 95.9 kg   SpO2 99%   BMI 33.13 kg/m   Physical Exam Vitals and nursing note reviewed.  Constitutional:      Appearance: Normal appearance.  HENT:     Head: Normocephalic.     Nose: Nose normal.     Mouth/Throat:     Mouth: Mucous membranes are moist.  Eyes:     Extraocular Movements: Extraocular movements intact.     Pupils: Pupils are equal, round, and reactive to light.  Cardiovascular:     Rate and Rhythm: Normal rate and regular rhythm.  Pulses: Normal pulses.     Heart sounds: Normal heart sounds.  Pulmonary:     Effort: Pulmonary effort is normal.     Breath sounds: Normal breath sounds.  Abdominal:     General: Abdomen is flat. There is no distension.     Palpations: Abdomen is soft.     Tenderness: There is no abdominal tenderness.     Comments: No abdominal tenderness   Genitourinary:    Comments: Rectal- no obvious stool impaction  Musculoskeletal:        General: Normal range of motion.     Cervical back: Normal range of motion.  Skin:    General: Skin is warm.     Capillary Refill: Capillary refill takes less than 2 seconds.  Neurological:     General: No focal deficit present.     Mental Status: She is alert.  Psychiatric:        Mood and Affect: Mood normal.        Behavior: Behavior normal.     ED Results / Procedures /  Treatments   Labs (all labs ordered are listed, but only abnormal results are displayed) Labs Reviewed  COMPREHENSIVE METABOLIC PANEL - Abnormal; Notable for the following components:      Result Value   Glucose, Bld 137 (*)    All other components within normal limits  CBC - Abnormal; Notable for the following components:   WBC 13.7 (*)    Hemoglobin 11.9 (*)    All other components within normal limits  URINALYSIS, ROUTINE W REFLEX MICROSCOPIC - Abnormal; Notable for the following components:   Ketones, ur 5 (*)    Protein, ur 30 (*)    All other components within normal limits  LIPASE, BLOOD    EKG None  Radiology DG Abd 2 Views  Result Date: 12/09/2019 CLINICAL DATA:  69 year old female with history of lower abdominal pain for the past 10 days. Constipation. EXAM: ABDOMEN - 2 VIEW COMPARISON:  No priors. FINDINGS: Gas and stool are seen scattered throughout the colon extending to the level of the distal rectum. No pathologic distension of small bowel is noted. No gross evidence of pneumoperitoneum. Calcification in the right upper pelvis, corresponding to a uterus calcification on recent prior CT. IMPRESSION: 1. Nonobstructive bowel gas pattern. 2. No pneumoperitoneum. Electronically Signed   By: Vinnie Langton M.D.   On: 12/09/2019 09:58    Procedures Procedures (including critical care time)  Medications Ordered in ED Medications  sodium chloride flush (NS) 0.9 % injection 3 mL (3 mLs Intravenous Not Given 12/09/19 2036)  sorbitol, milk of mag, mineral oil, glycerin (SMOG) enema (has no administration in time range)  sodium chloride 0.9 % bolus 1,000 mL (1,000 mLs Intravenous Bolus from Bag 12/09/19 2127)  lactulose (CHRONULAC) 10 GM/15ML solution 30 g (30 g Oral Given 12/09/19 2127)    ED Course  I have reviewed the triage vital signs and the nursing notes.  Pertinent labs & imaging results that were available during my care of the patient were reviewed by me and  considered in my medical decision making (see chart for details).    MDM Rules/Calculators/A&P                          Bethany Simon is a 69 y.o. female who presenting with constipation.  Patient had x-ray yesterday that showed no SBO.  Patient has no stool impaction on exam.  Plan to get  CBC, CMP.  Will try smog enema and IV fluids and lactulose.  12:23 AM Labs unremarkable.  Patient received lactulose and smog enema and had a large bowel movement and felt better.  I recommend that patient stay hydrated and take MiraLAX twice daily for a week.   Final Clinical Impression(s) / ED Diagnoses Final diagnoses:  None    Rx / DC Orders ED Discharge Orders    None       Drenda Freeze, MD 12/10/19 [phone]

## 2019-12-09 NOTE — ED Triage Notes (Signed)
Patient c/o constipation and states it has been approx one week since she last had a BM.

## 2019-12-10 NOTE — Discharge Instructions (Signed)
Stay hydrated   Take miralax twice daily for a week   See your doctor   Return to ER if you are constipated for a week, vomiting, severe abdominal pain, fever

## 2019-12-10 NOTE — ED Notes (Signed)
Pt had successful BM after enema administration.

## 2019-12-11 ENCOUNTER — Emergency Department (HOSPITAL_COMMUNITY)
Admission: EM | Admit: 2019-12-11 | Discharge: 2019-12-11 | Disposition: A | Discharge status: Home / Self Care | Payer: Medicare PPO | Attending: Emergency Medicine | Admitting: Emergency Medicine

## 2019-12-11 ENCOUNTER — Emergency Department (HOSPITAL_COMMUNITY): Payer: Medicare PPO

## 2019-12-11 ENCOUNTER — Other Ambulatory Visit: Payer: Self-pay

## 2019-12-11 ENCOUNTER — Encounter (HOSPITAL_COMMUNITY): Payer: Self-pay | Admitting: Emergency Medicine

## 2019-12-11 DIAGNOSIS — Z86718 Personal history of other venous thrombosis and embolism: Secondary | ICD-10-CM | POA: Insufficient documentation

## 2019-12-11 DIAGNOSIS — I10 Essential (primary) hypertension: Secondary | ICD-10-CM | POA: Insufficient documentation

## 2019-12-11 DIAGNOSIS — R10817 Generalized abdominal tenderness: Secondary | ICD-10-CM | POA: Diagnosis not present

## 2019-12-11 DIAGNOSIS — R1030 Lower abdominal pain, unspecified: Secondary | ICD-10-CM

## 2019-12-11 DIAGNOSIS — R109 Unspecified abdominal pain: Secondary | ICD-10-CM | POA: Diagnosis not present

## 2019-12-11 DIAGNOSIS — Z79899 Other long term (current) drug therapy: Secondary | ICD-10-CM | POA: Insufficient documentation

## 2019-12-11 DIAGNOSIS — R102 Pelvic and perineal pain: Secondary | ICD-10-CM | POA: Diagnosis present

## 2019-12-11 DIAGNOSIS — R110 Nausea: Secondary | ICD-10-CM | POA: Diagnosis not present

## 2019-12-11 DIAGNOSIS — R1900 Intra-abdominal and pelvic swelling, mass and lump, unspecified site: Secondary | ICD-10-CM

## 2019-12-11 DIAGNOSIS — Z87891 Personal history of nicotine dependence: Secondary | ICD-10-CM | POA: Insufficient documentation

## 2019-12-11 DIAGNOSIS — R1909 Other intra-abdominal and pelvic swelling, mass and lump: Secondary | ICD-10-CM | POA: Diagnosis not present

## 2019-12-11 DIAGNOSIS — Z7901 Long term (current) use of anticoagulants: Secondary | ICD-10-CM | POA: Diagnosis not present

## 2019-12-11 LAB — COMPREHENSIVE METABOLIC PANEL
ALT: 9 U/L (ref 0–44)
AST: 19 U/L (ref 15–41)
Albumin: 3.2 g/dL — ABNORMAL LOW (ref 3.5–5.0)
Alkaline Phosphatase: 82 U/L (ref 38–126)
Anion gap: 11 (ref 5–15)
BUN: 11 mg/dL (ref 8–23)
CO2: 25 mmol/L (ref 22–32)
Calcium: 9.2 mg/dL (ref 8.9–10.3)
Chloride: 105 mmol/L (ref 98–111)
Creatinine, Ser: 0.84 mg/dL (ref 0.44–1.00)
GFR calc Af Amer: >60 mL/min (ref >60)
GFR calc non Af Amer: >60 mL/min (ref >60)
Glucose, Bld: 100 mg/dL — ABNORMAL HIGH (ref 70–99)
Potassium: 5.4 mmol/L — ABNORMAL HIGH (ref 3.5–5.1)
Sodium: 141 mmol/L (ref 135–145)
Total Bilirubin: 0.8 mg/dL (ref 0.3–1.2)
Total Protein: 6.8 g/dL (ref 6.5–8.1)

## 2019-12-11 LAB — CBC WITH DIFFERENTIAL/PLATELET
Abs Immature Granulocytes: 0.08 10*3/uL — ABNORMAL HIGH (ref 0.00–0.07)
Basophils Absolute: 0 10*3/uL (ref 0.0–0.1)
Basophils Relative: 0 %
Eosinophils Absolute: 0 10*3/uL (ref 0.0–0.5)
Eosinophils Relative: 0 %
HCT: 35.5 % — ABNORMAL LOW (ref 36.0–46.0)
Hemoglobin: 10.9 g/dL — ABNORMAL LOW (ref 12.0–15.0)
Immature Granulocytes: 1 %
Lymphocytes Relative: 7 %
Lymphs Abs: 0.9 10*3/uL (ref 0.7–4.0)
MCH: 29 pg (ref 26.0–34.0)
MCHC: 30.7 g/dL (ref 30.0–36.0)
MCV: 94.4 fL (ref 80.0–100.0)
Monocytes Absolute: 0.8 10*3/uL (ref 0.1–1.0)
Monocytes Relative: 6 %
Neutro Abs: 11.7 10*3/uL — ABNORMAL HIGH (ref 1.7–7.7)
Neutrophils Relative %: 86 %
Platelets: 342 10*3/uL (ref 150–400)
RBC: 3.76 MIL/uL — ABNORMAL LOW (ref 3.87–5.11)
RDW: 12.8 % (ref 11.5–15.5)
WBC: 13.4 10*3/uL — ABNORMAL HIGH (ref 4.0–10.5)
nRBC: 0 % (ref 0.0–0.2)

## 2019-12-11 LAB — LIPASE, BLOOD: Lipase: 22 U/L (ref 11–51)

## 2019-12-11 LAB — LACTIC ACID, PLASMA: Lactic Acid, Venous: 0.8 mmol/L (ref 0.5–1.9)

## 2019-12-11 MED ORDER — IOHEXOL 300 MG/ML  SOLN
100.0000 mL | Freq: Once | INTRAMUSCULAR | Status: AC | PRN
  Administered 2019-12-11: 100 mL via INTRAVENOUS

## 2019-12-11 MED ORDER — ONDANSETRON HCL 4 MG/2ML IJ SOLN
4.0000 mg | Freq: Once | INTRAMUSCULAR | Status: AC
  Administered 2019-12-11: 4 mg via INTRAVENOUS
  Filled 2019-12-11: qty 2

## 2019-12-11 NOTE — Discharge Instructions (Signed)
Return for any problem.  Follow-up with your regular care providers as instructed.  Follow-up with GYN as instructed.  Call [phone] (Gyn) on Tuesday if you have not already been contacted. GYN should be able to see you within the next week for further workup.

## 2019-12-11 NOTE — ED Provider Notes (Signed)
[facility] EMERGENCY DEPARTMENT Provider Note   CSN: [id number] Arrival date & time: 12/11/19  8546     History Chief Complaint  Patient presents with  . Constipation    Bethany Simon is a 69 y.o. female.  69yo F w/ PMH below including HTN, HLD, DVT who p/w constipation and abd pain. PT presented here on 6/17 for 1 week of constipation.  She was given an enema and oral medication, had a bowel movement in the ED and was discharged home with instructions to take MiraLAX twice a day which she did yesterday.  She reports that she still felt bloated at time of discharge and has not had a bowel movement since discharge.  She continues to have abdominal pain, mostly in her lower abdomen and reports that she has had difficulty passing gas.  She has had nausea and has not wanted to eat but has not had any vomiting.  No fevers, urinary symptoms, or URI symptoms.  She denies any history of abdominal surgery.  The history is provided by the patient.  Constipation      Past Medical History:  Diagnosis Date  . DVT (deep venous thrombosis) ([place])   . Hyperlipidemia   . Hypertension     There are no problems to display for this patient.   Past Surgical History:  Procedure Laterality Date  . BREAST ENHANCEMENT SURGERY    . BREAST SURGERY       OB History   No obstetric history on file.     Family History  Problem Relation Age of Onset  . Cancer Mother   . Hypertension Mother   . Hypertension Father     Social History   Tobacco Use  . Smoking status: Former Research scientist (life sciences)  . Smokeless tobacco: Never Used  Vaping Use  . Vaping Use: Never used  Substance Use Topics  . Alcohol use: Yes    Comment: social  . Drug use: Never    Home Medications Prior to Admission medications   Medication Sig Start Date End Date Taking? Authorizing Provider  acetaminophen (TYLENOL) 500 MG tablet Take 1,000 mg by mouth every 8 (eight) hours as needed for moderate pain.   Yes  [provider]  apixaban (ELIQUIS) 5 MG TABS tablet Take 2 tablets (10mg ) twice daily for 7 days, then 1 tablet (5mg ) twice daily Patient taking differently: Take 5 mg by mouth 2 (two) times daily.  10/07/19  Yes Varney Biles, MD  atorvastatin (LIPITOR) 20 MG tablet Take 20 mg by mouth every evening.  09/30/19  Yes [provider]  lisinopril (ZESTRIL) 10 MG tablet Take 10 mg by mouth daily. 08/01/19  Yes [provider]  RESTASIS 0.05 % ophthalmic emulsion Place 1 drop into both eyes 2 (two) times daily. 08/30/19  Yes [provider]    Allergies    Atropine  Review of Systems   Review of Systems  Gastrointestinal: Positive for constipation.   All other systems reviewed and are negative except that which was mentioned in HPI  Physical Exam Updated Vital Signs BP (!) 144/59   Pulse 89   Temp 97.8 F (36.6 C) (Oral)   Resp 18   SpO2 99%   Physical Exam Vitals and nursing note reviewed.  Constitutional:      General: She is not in acute distress.    Appearance: She is well-developed.  HENT:     Head: Normocephalic and atraumatic.     Mouth/Throat:  Comments: Mildly dry mucous membranes Eyes:     Conjunctiva/sclera: Conjunctivae normal.  Cardiovascular:     Rate and Rhythm: Normal rate and regular rhythm.     Heart sounds: Normal heart sounds. No murmur heard.   Pulmonary:     Effort: Pulmonary effort is normal.     Breath sounds: Normal breath sounds.  Abdominal:     General: Bowel sounds are normal. There is no distension.     Palpations: Abdomen is soft.     Tenderness: There is abdominal tenderness. There is no guarding or rebound.     Comments: Generalized TTP worst in LLQ and RLQ  Musculoskeletal:     Cervical back: Neck supple.     Comments: Mild BLE edema  Skin:    General: Skin is warm and dry.  Neurological:     Mental Status: She is alert and oriented to person, place, and time.     Comments: Fluent speech    Psychiatric:        Judgment: Judgment normal.     ED Results / Procedures / Treatments   Labs (all labs ordered are listed, but only abnormal results are displayed) Labs Reviewed  COMPREHENSIVE METABOLIC PANEL - Abnormal; Notable for the following components:      Result Value   Potassium 5.4 (*)    Glucose, Bld 100 (*)    Albumin 3.2 (*)    All other components within normal limits  CBC WITH DIFFERENTIAL/PLATELET - Abnormal; Notable for the following components:   WBC 13.4 (*)    RBC 3.76 (*)    Hemoglobin 10.9 (*)    HCT 35.5 (*)    Neutro Abs 11.7 (*)    Abs Immature Granulocytes 0.08 (*)    All other components within normal limits  LIPASE, BLOOD  LACTIC ACID, PLASMA    EKG None  Radiology No results found.  Procedures Procedures (including critical care time)  Medications Ordered in ED Medications  ondansetron (ZOFRAN) injection 4 mg (4 mg Intravenous Given 12/11/19 1119)  iohexol (OMNIPAQUE) 300 MG/ML solution 100 mL (100 mLs Intravenous Contrast Given 12/11/19 1451)    ED Course  I have reviewed the triage vital signs and the nursing notes.  Pertinent labs & imaging results that were available during my care of the patient were reviewed by me and considered in my medical decision making (see chart for details).    MDM Rules/Calculators/A&P                          Patient with tenderness across her lower abdomen on exam.  Based on persistence of abdominal pain with difficulty passing flatus and stool, differential includes bowel obstruction, colon mass, severe constipation, colitis.  Lab work shows potassium 5.4, normal LFTs, WBC 13.4, normal lipase, normal lactate.    Because of her tenderness on exam and worsening symptoms, I have ordered CT of abdomen and pelvis.  I am signing out to oncoming provider pending CT results. Final Clinical Impression(s) / ED Diagnoses Final diagnoses:  None    Rx / DC Orders ED Discharge Orders    None        Anatalia Kronk, Wenda Overland, MD 12/11/19 1527

## 2019-12-11 NOTE — ED Provider Notes (Signed)
Patient seen after prior ED provider.  At the time of my evaluation she is pain-free and comfortable.  She desires discharge.  CT results were discussed with the patient.  She understands the imperative need for further work-up with GYN.   GYN contacted regarding this case.  The patient will be contacted on Monday for outpatient appointment for further work-up of her pelvic mass.   Patient was offered admission for further pain control.  She declines admission.  She desires discharge.  She does understand the need for close follow-up.  Strict return precautions given and understood.   Valarie Merino, MD 12/11/19 [phone]

## 2019-12-11 NOTE — ED Triage Notes (Signed)
Pt. Stated, Ive not to the [facility] in 10 to 12 days. I was in [place] on Thursday and was given an enema and Im not better. I can't eat.

## 2019-12-11 NOTE — ED Notes (Signed)
Patient verbalizes understanding of discharge instructions. Opportunity for questioning and answers were provided. Armband removed by staff, pt discharged from ED ambulatory by self\  

## 2019-12-13 ENCOUNTER — Telehealth: Payer: Self-pay | Admitting: Obstetrics and Gynecology

## 2019-12-13 NOTE — Telephone Encounter (Signed)
Spoke with patient to get her scheduled at our office for a follow-up appointment after her ED visit. Patient stated that she is going to see if she can get scheduled at her old OBGYN or at her doctor's office. Patient stated that she will give our office a call back if she cannot get an appointment with them.

## 2019-12-14 ENCOUNTER — Telehealth (INDEPENDENT_AMBULATORY_CARE_PROVIDER_SITE_OTHER): Payer: Medicare PPO | Admitting: Lactation Services

## 2019-12-14 DIAGNOSIS — R1900 Intra-abdominal and pelvic swelling, mass and lump, unspecified site: Secondary | ICD-10-CM

## 2019-12-14 NOTE — Telephone Encounter (Signed)
Called patient per Dr. Elly Modena. Stressed importance of follow up for pelvic mass. She reports she has spoken with her PCP. Dr. Harlan Stains and there is a plan for her to be scheduled with [facility] for follow up. She was given phone number and will call back if appointment is needed.

## 2019-12-15 ENCOUNTER — Telehealth: Payer: Self-pay | Admitting: Obstetrics and Gynecology

## 2019-12-15 NOTE — Telephone Encounter (Signed)
Thank you so much for this update. I needed to make sure that someone addressed this pelvic mass. Happy to hear that she will follow up with [facility]

## 2019-12-15 NOTE — Telephone Encounter (Signed)
Patient called to say she did not need this appointment.

## 2019-12-16 ENCOUNTER — Telehealth: Payer: Self-pay

## 2019-12-16 DIAGNOSIS — N83201 Unspecified ovarian cyst, right side: Secondary | ICD-10-CM | POA: Diagnosis not present

## 2019-12-16 DIAGNOSIS — R1900 Intra-abdominal and pelvic swelling, mass and lump, unspecified site: Secondary | ICD-10-CM | POA: Diagnosis not present

## 2019-12-16 NOTE — Telephone Encounter (Signed)
Pt called to cancel her upcoming surgery due to not feeling well. She does not want to r/s at this time.

## 2019-12-16 NOTE — Telephone Encounter (Signed)
Opened in error

## 2019-12-17 ENCOUNTER — Ambulatory Visit: Payer: Medicare PPO | Admitting: Vascular Surgery

## 2019-12-17 ENCOUNTER — Ambulatory Visit: Payer: Medicare PPO | Admitting: Obstetrics and Gynecology

## 2019-12-17 DIAGNOSIS — N83209 Unspecified ovarian cyst, unspecified side: Secondary | ICD-10-CM | POA: Diagnosis not present

## 2019-12-17 DIAGNOSIS — R1900 Intra-abdominal and pelvic swelling, mass and lump, unspecified site: Secondary | ICD-10-CM | POA: Diagnosis not present

## 2019-12-17 DIAGNOSIS — R935 Abnormal findings on diagnostic imaging of other abdominal regions, including retroperitoneum: Secondary | ICD-10-CM | POA: Diagnosis not present

## 2019-12-17 DIAGNOSIS — D72829 Elevated white blood cell count, unspecified: Secondary | ICD-10-CM | POA: Diagnosis not present

## 2019-12-17 DIAGNOSIS — K5900 Constipation, unspecified: Secondary | ICD-10-CM | POA: Diagnosis not present

## 2019-12-17 DIAGNOSIS — R1030 Lower abdominal pain, unspecified: Secondary | ICD-10-CM | POA: Diagnosis not present

## 2019-12-17 DIAGNOSIS — Z86010 Personal history of colonic polyps: Secondary | ICD-10-CM | POA: Diagnosis not present

## 2019-12-17 DIAGNOSIS — Z86718 Personal history of other venous thrombosis and embolism: Secondary | ICD-10-CM | POA: Diagnosis not present

## 2019-12-17 DIAGNOSIS — K219 Gastro-esophageal reflux disease without esophagitis: Secondary | ICD-10-CM | POA: Diagnosis not present

## 2019-12-20 ENCOUNTER — Other Ambulatory Visit: Payer: Self-pay | Admitting: Physician Assistant

## 2019-12-20 ENCOUNTER — Ambulatory Visit (HOSPITAL_COMMUNITY): Admission: RE | Admit: 2019-12-20 | Payer: Medicare PPO | Source: Home / Self Care | Admitting: Vascular Surgery

## 2019-12-20 ENCOUNTER — Encounter (HOSPITAL_COMMUNITY): Admission: RE | Payer: Self-pay | Source: Home / Self Care

## 2019-12-20 DIAGNOSIS — R1900 Intra-abdominal and pelvic swelling, mass and lump, unspecified site: Secondary | ICD-10-CM

## 2019-12-20 DIAGNOSIS — R1030 Lower abdominal pain, unspecified: Secondary | ICD-10-CM

## 2019-12-20 SURGERY — LOWER EXTREMITY VENOGRAPHY
Anesthesia: LOCAL

## 2019-12-22 DIAGNOSIS — N83201 Unspecified ovarian cyst, right side: Secondary | ICD-10-CM | POA: Diagnosis not present

## 2019-12-22 DIAGNOSIS — R1900 Intra-abdominal and pelvic swelling, mass and lump, unspecified site: Secondary | ICD-10-CM | POA: Diagnosis not present

## 2019-12-23 ENCOUNTER — Telehealth: Payer: Self-pay | Admitting: *Deleted

## 2019-12-23 NOTE — Progress Notes (Signed)
GYNECOLOGIC ONCOLOGY NEW PATIENT CONSULTATION   Patient Name: Bethany Simon  Patient Age: 69 y.o. Date of Service: 12/24/19 Referring Provider: Dr. Nelda Marseille   Primary Care Provider: Harlan Stains, MD Consulting Provider: Jeral Pinch, MD   Assessment/Plan:  Postmenopausal patient with a history of 1 month of worsening abdominal symptoms found to have a large complex pelvic mass.  Discussed finding on the CT scan.  While this is thought to potentially be an adnexal mass, based on her exam today, I am concerned that it may be uterine mass.  In the setting of her prior cervical surgery and stenotic os, it may be that she has not developed vaginal bleeding because of her stenotic cervix.  The only way to definitively make a diagnosis as well as to therapeutically treat her symptoms is to proceed with surgery.  We discussed the complexity of surgery given her recent diagnosis of an extensive lower extremity DVT in the setting of May Thurner syndrome.  I think she may need repeat imaging to assess the status of her VTE and given the proximity to its diagnosis, I would favor having an IVC filter placed before surgery.  We also discussed that she will need to be transitioned off of the Eliquis and may require a Lovenox bridge for the 2 days that we have to hold her Eliquis before surgery.  The plan would then be to restart her anticoagulation as soon after surgery as safe and possible.  I will reach out both to the vascular surgeon that she saw as well as her primary care provider, who is managing her Eliquis.  I am tentatively scheduling surgery for July 15 to allow for adequate time for any further work-up and for IVC filter placement.  We discussed plan for diagnostic laparoscopy to assess pelvic organs.  If it seems possible to proceed with a robotic approach, then I would plan to perform either robotic BSO (if pelvic mass is adnexal in origin) versus total hysterectomy and BSO if uterine in origin.  If  it is not feasible either from a size standpoint or there is evidence of metastatic disease that I cannot resect robotically, then I would proceed with exploratory laparotomy.  The plan would be to send the pelvic mass to pathology for frozen section during surgery.  If benign pathology, then we discussed surgery would only include BSO versus BSO and hysterectomy.  If a borderline tumor is identified, then the surgery would involve total hysterectomy and BSO.  If a cancer is identified, then we discussed surgical staging.  For uterine cancer, this would be sampling of lymph nodes in any tumor debulking.  If ovarian cancer, then it may include pelvic and para-aortic lymphadenectomy, partial omentectomy, peritoneal biopsies, and any tumor debulking necessary.  I think it is very unlikely, but if this appears to be an ovarian cancer and the patient has significant intra-abdominal disease including mesenteric and peritoneal disease that I do not think I can resect, then I would plan to biopsy the tissue at the time of surgery and we would opt for a neoadjuvant approach.  The plan that we discussed today is for a diagnostic laparoscopy, possible robotic versus open bilateral salpingo-oophorectomy, possible total hysterectomy, possible staging which may include lymphadenectomy, omentectomy, and any other indicated procedures. The risks of surgery were discussed in detail and she understands these to include infection; wound separation; hernia; vaginal cuff separation, injury to adjacent organs such as bowel, bladder, blood vessels, ureters and nerves; bleeding which may  require blood transfusion; anesthesia risk; thromboembolic events; possible death; unforeseen complications; possible need for re-exploration; medical complications such as heart attack, stroke, pleural effusion and pneumonia; and, if full lymphadenectomy is performed the risk of lymphedema and lymphocyst. The patient will receive DVT and antibiotic  prophylaxis as indicated. She voiced a clear understanding. She had the opportunity to ask questions. Perioperative instructions were reviewed with her. Prescriptions for post-op medications were sent to her pharmacy of choice.  A copy of this note was sent to the patient's referring provider.   65 minutes of total time was spent for this patient encounter, including preparation, face-to-face counseling with the patient and coordination of care, and documentation of the encounter.  Jeral Pinch, MD  Division of Gynecologic Oncology  Department of Obstetrics and Gynecology  [facility]  ___________________________________________  Chief Complaint: Chief Complaint  Patient presents with  . Pelvic mass    New Patient    History of Present Illness:  Bethany Simon is a 69 y.o. y.o. female who is seen in consultation at the request of Dr. Nelda Marseille for an evaluation of abdominal symptoms and a large complex pelvic mass.  Patient was seen in mid April in the emergency department for left leg pain as well as fatigue.  She would diagnosed with a large left lower extremity DVT and was started on Eliquis.  About a month ago, in addition to fatigue, she developed general malaise, decreased appetite and significant constipation.  She notes that in addition to decreased appetite, food has no appeal to her.  She is unsure regarding any early satiety.  She has lost about 8 pounds in the last 3 to 4 weeks.  She has intermittent nausea, sometimes with food intake, but denies any emesis.  For her constipation, she has recently seen GI and her bowel function has improved.  She was given samples of Linzess and is also taking Gas-X.  She was initially having fairly significant gas pain as well as difficulty with elimination.  More recently, she has been to the emergency room twice, with the first time receiving an enema which helped her have some bowel function and again 2 days later.  At  the subsequent visit, she underwent repeat CT scan which again confirmed this pelvic mass.  Today, she reports overall "generally not feeling well."  She denies any significant abdominal or pelvic pain but occasionally has a twinge of pain on the right or left in her lower abdomen.  She feels some lower abdominal pressure and possibly intermittent bloating.  She denies any urinary symptoms.  She initially had swelling of her left leg when she presented and was diagnosed with a DVT but denies any current lower extremity edema.  She denies any shortness of breath or chest pain either at rest or with ambulation.  She can climb a flight of stairs without any difficulty.  Patient lives in [place] with her husband.  She is retired from [employer].  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  PAST MEDICAL HISTORY:  Past Medical History:  Diagnosis Date  . DVT (deep venous thrombosis) ([place])   . GERD (gastroesophageal reflux disease)   . Hyperlipidemia   . Hypertension   . May-Thurner syndrome   . Osteopenia   . Pelvic mass   . SCC (squamous cell carcinoma)      PAST SURGICAL HISTORY:  Past Surgical History:  Procedure Laterality Date  . BREAST ENHANCEMENT SURGERY    . BREAST  SURGERY    . CATARACT EXTRACTION Bilateral 2016  . LEEP  2000    OB/GYN HISTORY:  OB History  Gravida Para Term Preterm AB Living  3 3          SAB TAB Ectopic Multiple Live Births               # Outcome Date GA Lbr Len/2nd Weight Sex Delivery Anes PTL Lv  3 Para           2 Para           1 Para             No LMP recorded. Patient is postmenopausal.  Age at menarche: 40  Age at menopause: 80, denies PMB Hx of HRT: dnies Hx of STDs: denies Last pap: 05/2015  History of abnormal pap smears: yes, remote hx of abn paps with LEEP in 2000 - normal paps since  SCREENING STUDIES:  Last mammogram: 2019  Last colonoscopy: 2016, has one scheduled soon Last bone mineral density:  2019  MEDICATIONS: Outpatient Encounter Medications as of 12/24/2019  Medication Sig  . acetaminophen (TYLENOL) 500 MG tablet Take 1,000 mg by mouth every 8 (eight) hours as needed for moderate pain.  Marland Kitchen apixaban (ELIQUIS) 5 MG TABS tablet Take 2 tablets (10mg ) twice daily for 7 days, then 1 tablet (5mg ) twice daily (Patient taking differently: Take 5 mg by mouth 2 (two) times daily. )  . atorvastatin (LIPITOR) 20 MG tablet Take 20 mg by mouth every evening.   Marland Kitchen lisinopril (ZESTRIL) 10 MG tablet Take 10 mg by mouth daily.  . ondansetron (ZOFRAN-ODT) 8 MG disintegrating tablet   . RESTASIS 0.05 % ophthalmic emulsion Place 1 drop into both eyes 2 (two) times daily.  Marland Kitchen oxyCODONE (OXY IR/ROXICODONE) 5 MG immediate release tablet Take 1 tablet (5 mg total) by mouth every 4 (four) hours as needed for severe pain. For AFTER surgery, do not take and drive  . senna-docusate (SENOKOT-S) 8.6-50 MG tablet Take 2 tablets by mouth at bedtime. For AFTER surgery, do not take if having diarrhea  . [DISCONTINUED] ciprofloxacin (CIPRO) 250 MG tablet  (Patient not taking: Reported on 12/24/2019)  . [DISCONTINUED] metroNIDAZOLE (FLAGYL) 250 MG tablet  (Patient not taking: Reported on 12/24/2019)   No facility-administered encounter medications on file as of 12/24/2019.    ALLERGIES:  Allergies  Allergen Reactions  . Atropine     convulsion     FAMILY HISTORY:  Family History  Problem Relation Age of Onset  . Cancer Mother        breast, dx in last 10s  . Hypertension Mother   . Diabetes Mother        pre diabetes  . Hypertension Father   . Cancer Brother        brain  . Diabetes Brother   . Cancer Maternal Aunt        possible breast  . Cancer Daughter        thyroid  . Cancer Paternal Uncle        colon  . Breast cancer Maternal Grandmother        dx in 67s     SOCIAL HISTORY:    Social Connections:   . Frequency of Communication with Friends and Family:   . Frequency of Social Gatherings with  Friends and Family:   . Attends Religious Services:   . Active Member of Clubs or Organizations:   . Attends Club or  Organization Meetings:   Marland Kitchen Marital Status:     REVIEW OF SYSTEMS:  Pertinent positives include decreased appetite, fatigue, abdominal pain, constipation, nausea Denies fevers, chills. Denies hearing loss, neck lumps or masses, mouth sores, ringing in ears or voice changes. Denies cough or wheezing.  Denies shortness of breath. Denies chest pain or palpitations. Denies leg swelling. Denies abdominal distention, blood in stools, diarrhea, vomiting, or early satiety. Denies pain with intercourse, dysuria, frequency, hematuria or incontinence. Denies hot flashes, pelvic pain, vaginal bleeding or vaginal discharge.   Denies joint pain, back pain or muscle pain/cramps. Denies itching, rash, or wounds. Denies dizziness, headaches, numbness or seizures. Denies swollen lymph nodes or glands, denies easy bruising or bleeding. Denies anxiety, depression, confusion, or decreased concentration.  Physical Exam:  Vital Signs for this encounter:  Blood pressure 134/68, pulse (!) 111, temperature 98.7 F (37.1 C), temperature source Oral, resp. rate 16, height 5\' 7"  (1.702 m), weight 203 lb 8 oz (92.3 kg), SpO2 98 %. Body mass index is 31.87 kg/m. General: Alert, oriented, no acute distress.  HEENT: Normocephalic, atraumatic. Sclera anicteric.  Chest: Clear to auscultation bilaterally. No wheezes, rhonchi, or rales. Cardiovascular: Regular rate and rhythm, no murmurs, rubs, or gallops.  Abdomen: Obese. Normoactive bowel sounds. Soft, nondistended, nontender to palpation. No masses or hepatosplenomegaly appreciated, fullness in the lower abdomen. No palpable fluid wave.  Extremities: Grossly normal range of motion. Warm, well perfused. No edema bilaterally.  Skin: No rashes or lesions.  Lymphatics: No cervical, supraclavicular, or inguinal adenopathy.  GU:  Normal external female  genitalia. No lesions. No discharge or bleeding.             Bladder/urethra:  No lesions or masses, well supported bladder             Vagina: Mildly atrophic, no lesions or masses.             Cervix: Normal appearing, no lesions.  Cervical os stenotic consistent with patient's prior history of LEEP procedure.  Cytobrush used to attempt cervical dilation and unsuccessful.             Bimanual: Exam seems more consistent with a large uterine mass spanning approximately 14-16 cm.  If this is adnexal, it moves in conjunction with the uterus.  The lower uterine segment itself feels fairly bulbous although it is smooth on both vaginal and rectovaginal exam.  LABORATORY AND RADIOLOGIC DATA:  Outside medical records were reviewed to synthesize the above history, along with the history and physical obtained during the visit.   Lab Results  Component Value Date   WBC 13.4 (H) 12/11/2019   HGB 10.9 (L) 12/11/2019   HCT 35.5 (L) 12/11/2019   PLT 342 12/11/2019   GLUCOSE 100 (H) 12/11/2019   ALT 9 12/11/2019   AST 19 12/11/2019   NA 141 12/11/2019   K 5.4 (H) 12/11/2019   CL 105 12/11/2019   CREATININE 0.84 12/11/2019   BUN 11 12/11/2019   CO2 25 12/11/2019   CT A/P on 6/19: 1. Heterogeneous mass within the pelvis with apparent areas of cystic or necrotic changes concerning for malignancy. Further evaluation with MRI without and with contrast is recommended. 2. No bowel obstruction. 3. Compression of the left common iliac vein between the right common iliac artery and spine as seen on the prior CT. 4. Aortic Atherosclerosis (ICD10-I70.0).  Pelvic ultrasound on 6/30: Uterus not visualized.  Neither ovary visualized.  Large complex irregular, lobular mass seen in the midline  pelvis-to the right adnexa with cystic and solid components measuring 14.8 x 12.3 x 11.7 cm.  Some blood flow seen within the mass.

## 2019-12-23 NOTE — Telephone Encounter (Signed)
Called and scheduled the patient for a new patient appt tomorrow. Gave the address for the clinic. Gave the policy for visitors and masks

## 2019-12-23 NOTE — H&P (View-Only) (Signed)
GYNECOLOGIC ONCOLOGY NEW PATIENT CONSULTATION   Patient Name: Bethany Simon  Patient Age: 69 y.o. Date of Service: 12/24/19 Referring Provider: Dr. Nelda Marseille   Primary Care Provider: Harlan Stains, MD Consulting Provider: Jeral Pinch, MD   Assessment/Plan:  Postmenopausal patient with a history of 1 month of worsening abdominal symptoms found to have a large complex pelvic mass.  Discussed finding on the CT scan.  While this is thought to potentially be an adnexal mass, based on her exam today, I am concerned that it may be uterine mass.  In the setting of her prior cervical surgery and stenotic os, it may be that she has not developed vaginal bleeding because of her stenotic cervix.  The only way to definitively make a diagnosis as well as to therapeutically treat her symptoms is to proceed with surgery.  We discussed the complexity of surgery given her recent diagnosis of an extensive lower extremity DVT in the setting of May Thurner syndrome.  I think she may need repeat imaging to assess the status of her VTE and given the proximity to its diagnosis, I would favor having an IVC filter placed before surgery.  We also discussed that she will need to be transitioned off of the Eliquis and may require a Lovenox bridge for the 2 days that we have to hold her Eliquis before surgery.  The plan would then be to restart her anticoagulation as soon after surgery as safe and possible.  I will reach out both to the vascular surgeon that she saw as well as her primary care provider, who is managing her Eliquis.  I am tentatively scheduling surgery for July 15 to allow for adequate time for any further work-up and for IVC filter placement.  We discussed plan for diagnostic laparoscopy to assess pelvic organs.  If it seems possible to proceed with a robotic approach, then I would plan to perform either robotic BSO (if pelvic mass is adnexal in origin) versus total hysterectomy and BSO if uterine in origin.  If  it is not feasible either from a size standpoint or there is evidence of metastatic disease that I cannot resect robotically, then I would proceed with exploratory laparotomy.  The plan would be to send the pelvic mass to pathology for frozen section during surgery.  If benign pathology, then we discussed surgery would only include BSO versus BSO and hysterectomy.  If a borderline tumor is identified, then the surgery would involve total hysterectomy and BSO.  If a cancer is identified, then we discussed surgical staging.  For uterine cancer, this would be sampling of lymph nodes in any tumor debulking.  If ovarian cancer, then it may include pelvic and para-aortic lymphadenectomy, partial omentectomy, peritoneal biopsies, and any tumor debulking necessary.  I think it is very unlikely, but if this appears to be an ovarian cancer and the patient has significant intra-abdominal disease including mesenteric and peritoneal disease that I do not think I can resect, then I would plan to biopsy the tissue at the time of surgery and we would opt for a neoadjuvant approach.  The plan that we discussed today is for a diagnostic laparoscopy, possible robotic versus open bilateral salpingo-oophorectomy, possible total hysterectomy, possible staging which may include lymphadenectomy, omentectomy, and any other indicated procedures. The risks of surgery were discussed in detail and she understands these to include infection; wound separation; hernia; vaginal cuff separation, injury to adjacent organs such as bowel, bladder, blood vessels, ureters and nerves; bleeding which may  require blood transfusion; anesthesia risk; thromboembolic events; possible death; unforeseen complications; possible need for re-exploration; medical complications such as heart attack, stroke, pleural effusion and pneumonia; and, if full lymphadenectomy is performed the risk of lymphedema and lymphocyst. The patient will receive DVT and antibiotic  prophylaxis as indicated. She voiced a clear understanding. She had the opportunity to ask questions. Perioperative instructions were reviewed with her. Prescriptions for post-op medications were sent to her pharmacy of choice.  A copy of this note was sent to the patient's referring provider.   65 minutes of total time was spent for this patient encounter, including preparation, face-to-face counseling with the patient and coordination of care, and documentation of the encounter.  Jeral Pinch, MD  Division of Gynecologic Oncology  Department of Obstetrics and Gynecology  [facility]  ___________________________________________  Chief Complaint: Chief Complaint  Patient presents with  . Pelvic mass    New Patient    History of Present Illness:  Bethany Simon is a 69 y.o. y.o. female who is seen in consultation at the request of Dr. Nelda Marseille for an evaluation of abdominal symptoms and a large complex pelvic mass.  Patient was seen in mid April in the emergency department for left leg pain as well as fatigue.  She would diagnosed with a large left lower extremity DVT and was started on Eliquis.  About a month ago, in addition to fatigue, she developed general malaise, decreased appetite and significant constipation.  She notes that in addition to decreased appetite, food has no appeal to her.  She is unsure regarding any early satiety.  She has lost about 8 pounds in the last 3 to 4 weeks.  She has intermittent nausea, sometimes with food intake, but denies any emesis.  For her constipation, she has recently seen GI and her bowel function has improved.  She was given samples of Linzess and is also taking Gas-X.  She was initially having fairly significant gas pain as well as difficulty with elimination.  More recently, she has been to the emergency room twice, with the first time receiving an enema which helped her have some bowel function and again 2 days later.  At  the subsequent visit, she underwent repeat CT scan which again confirmed this pelvic mass.  Today, she reports overall "generally not feeling well."  She denies any significant abdominal or pelvic pain but occasionally has a twinge of pain on the right or left in her lower abdomen.  She feels some lower abdominal pressure and possibly intermittent bloating.  She denies any urinary symptoms.  She initially had swelling of her left leg when she presented and was diagnosed with a DVT but denies any current lower extremity edema.  She denies any shortness of breath or chest pain either at rest or with ambulation.  She can climb a flight of stairs without any difficulty.  Patient lives in [place] with her husband.  She is retired from [employer].  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  PAST MEDICAL HISTORY:  Past Medical History:  Diagnosis Date  . DVT (deep venous thrombosis) ([place])   . GERD (gastroesophageal reflux disease)   . Hyperlipidemia   . Hypertension   . May-Thurner syndrome   . Osteopenia   . Pelvic mass   . SCC (squamous cell carcinoma)      PAST SURGICAL HISTORY:  Past Surgical History:  Procedure Laterality Date  . BREAST ENHANCEMENT SURGERY    . BREAST  SURGERY    . CATARACT EXTRACTION Bilateral 2016  . LEEP  2000    OB/GYN HISTORY:  OB History  Gravida Para Term Preterm AB Living  3 3          SAB TAB Ectopic Multiple Live Births               # Outcome Date GA Lbr Len/2nd Weight Sex Delivery Anes PTL Lv  3 Para           2 Para           1 Para             No LMP recorded. Patient is postmenopausal.  Age at menarche: 16  Age at menopause: 3, denies PMB Hx of HRT: dnies Hx of STDs: denies Last pap: 05/2015  History of abnormal pap smears: yes, remote hx of abn paps with LEEP in 2000 - normal paps since  SCREENING STUDIES:  Last mammogram: 2019  Last colonoscopy: 2016, has one scheduled soon Last bone mineral density:  2019  MEDICATIONS: Outpatient Encounter Medications as of 12/24/2019  Medication Sig  . acetaminophen (TYLENOL) 500 MG tablet Take 1,000 mg by mouth every 8 (eight) hours as needed for moderate pain.  Marland Kitchen apixaban (ELIQUIS) 5 MG TABS tablet Take 2 tablets (10mg ) twice daily for 7 days, then 1 tablet (5mg ) twice daily (Patient taking differently: Take 5 mg by mouth 2 (two) times daily. )  . atorvastatin (LIPITOR) 20 MG tablet Take 20 mg by mouth every evening.   Marland Kitchen lisinopril (ZESTRIL) 10 MG tablet Take 10 mg by mouth daily.  . ondansetron (ZOFRAN-ODT) 8 MG disintegrating tablet   . RESTASIS 0.05 % ophthalmic emulsion Place 1 drop into both eyes 2 (two) times daily.  Marland Kitchen oxyCODONE (OXY IR/ROXICODONE) 5 MG immediate release tablet Take 1 tablet (5 mg total) by mouth every 4 (four) hours as needed for severe pain. For AFTER surgery, do not take and drive  . senna-docusate (SENOKOT-S) 8.6-50 MG tablet Take 2 tablets by mouth at bedtime. For AFTER surgery, do not take if having diarrhea  . [DISCONTINUED] ciprofloxacin (CIPRO) 250 MG tablet  (Patient not taking: Reported on 12/24/2019)  . [DISCONTINUED] metroNIDAZOLE (FLAGYL) 250 MG tablet  (Patient not taking: Reported on 12/24/2019)   No facility-administered encounter medications on file as of 12/24/2019.    ALLERGIES:  Allergies  Allergen Reactions  . Atropine     convulsion     FAMILY HISTORY:  Family History  Problem Relation Age of Onset  . Cancer Mother        breast, dx in last 48s  . Hypertension Mother   . Diabetes Mother        pre diabetes  . Hypertension Father   . Cancer Brother        brain  . Diabetes Brother   . Cancer Maternal Aunt        possible breast  . Cancer Daughter        thyroid  . Cancer Paternal Uncle        colon  . Breast cancer Maternal Grandmother        dx in 62s     SOCIAL HISTORY:    Social Connections:   . Frequency of Communication with Friends and Family:   . Frequency of Social Gatherings with  Friends and Family:   . Attends Religious Services:   . Active Member of Clubs or Organizations:   . Attends Club or  Organization Meetings:   Marland Kitchen Marital Status:     REVIEW OF SYSTEMS:  Pertinent positives include decreased appetite, fatigue, abdominal pain, constipation, nausea Denies fevers, chills. Denies hearing loss, neck lumps or masses, mouth sores, ringing in ears or voice changes. Denies cough or wheezing.  Denies shortness of breath. Denies chest pain or palpitations. Denies leg swelling. Denies abdominal distention, blood in stools, diarrhea, vomiting, or early satiety. Denies pain with intercourse, dysuria, frequency, hematuria or incontinence. Denies hot flashes, pelvic pain, vaginal bleeding or vaginal discharge.   Denies joint pain, back pain or muscle pain/cramps. Denies itching, rash, or wounds. Denies dizziness, headaches, numbness or seizures. Denies swollen lymph nodes or glands, denies easy bruising or bleeding. Denies anxiety, depression, confusion, or decreased concentration.  Physical Exam:  Vital Signs for this encounter:  Blood pressure 134/68, pulse (!) 111, temperature 98.7 F (37.1 C), temperature source Oral, resp. rate 16, height 5\' 7"  (1.702 m), weight 203 lb 8 oz (92.3 kg), SpO2 98 %. Body mass index is 31.87 kg/m. General: Alert, oriented, no acute distress.  HEENT: Normocephalic, atraumatic. Sclera anicteric.  Chest: Clear to auscultation bilaterally. No wheezes, rhonchi, or rales. Cardiovascular: Regular rate and rhythm, no murmurs, rubs, or gallops.  Abdomen: Obese. Normoactive bowel sounds. Soft, nondistended, nontender to palpation. No masses or hepatosplenomegaly appreciated, fullness in the lower abdomen. No palpable fluid wave.  Extremities: Grossly normal range of motion. Warm, well perfused. No edema bilaterally.  Skin: No rashes or lesions.  Lymphatics: No cervical, supraclavicular, or inguinal adenopathy.  GU:  Normal external female  genitalia. No lesions. No discharge or bleeding.             Bladder/urethra:  No lesions or masses, well supported bladder             Vagina: Mildly atrophic, no lesions or masses.             Cervix: Normal appearing, no lesions.  Cervical os stenotic consistent with patient's prior history of LEEP procedure.  Cytobrush used to attempt cervical dilation and unsuccessful.             Bimanual: Exam seems more consistent with a large uterine mass spanning approximately 14-16 cm.  If this is adnexal, it moves in conjunction with the uterus.  The lower uterine segment itself feels fairly bulbous although it is smooth on both vaginal and rectovaginal exam.  LABORATORY AND RADIOLOGIC DATA:  Outside medical records were reviewed to synthesize the above history, along with the history and physical obtained during the visit.   Lab Results  Component Value Date   WBC 13.4 (H) 12/11/2019   HGB 10.9 (L) 12/11/2019   HCT 35.5 (L) 12/11/2019   PLT 342 12/11/2019   GLUCOSE 100 (H) 12/11/2019   ALT 9 12/11/2019   AST 19 12/11/2019   NA 141 12/11/2019   K 5.4 (H) 12/11/2019   CL 105 12/11/2019   CREATININE 0.84 12/11/2019   BUN 11 12/11/2019   CO2 25 12/11/2019   CT A/P on 6/19: 1. Heterogeneous mass within the pelvis with apparent areas of cystic or necrotic changes concerning for malignancy. Further evaluation with MRI without and with contrast is recommended. 2. No bowel obstruction. 3. Compression of the left common iliac vein between the right common iliac artery and spine as seen on the prior CT. 4. Aortic Atherosclerosis (ICD10-I70.0).  Pelvic ultrasound on 6/30: Uterus not visualized.  Neither ovary visualized.  Large complex irregular, lobular mass seen in the midline  pelvis-to the right adnexa with cystic and solid components measuring 14.8 x 12.3 x 11.7 cm.  Some blood flow seen within the mass.

## 2019-12-24 ENCOUNTER — Other Ambulatory Visit: Payer: Self-pay | Admitting: Gynecologic Oncology

## 2019-12-24 ENCOUNTER — Inpatient Hospital Stay: Payer: Medicare PPO | Attending: Gynecologic Oncology | Admitting: Gynecologic Oncology

## 2019-12-24 ENCOUNTER — Inpatient Hospital Stay: Payer: Medicare PPO

## 2019-12-24 ENCOUNTER — Telehealth: Payer: Self-pay | Admitting: *Deleted

## 2019-12-24 ENCOUNTER — Encounter: Payer: Self-pay | Admitting: Gynecologic Oncology

## 2019-12-24 ENCOUNTER — Other Ambulatory Visit: Payer: Self-pay

## 2019-12-24 VITALS — BP 134/68 | HR 111 | Temp 98.7°F | Resp 16 | Ht 67.0 in | Wt 203.5 lb

## 2019-12-24 DIAGNOSIS — M8580 Other specified disorders of bone density and structure, unspecified site: Secondary | ICD-10-CM | POA: Insufficient documentation

## 2019-12-24 DIAGNOSIS — R971 Elevated cancer antigen 125 [CA 125]: Secondary | ICD-10-CM | POA: Insufficient documentation

## 2019-12-24 DIAGNOSIS — I82402 Acute embolism and thrombosis of unspecified deep veins of left lower extremity: Secondary | ICD-10-CM | POA: Diagnosis not present

## 2019-12-24 DIAGNOSIS — Z7901 Long term (current) use of anticoagulants: Secondary | ICD-10-CM | POA: Diagnosis not present

## 2019-12-24 DIAGNOSIS — R1900 Intra-abdominal and pelvic swelling, mass and lump, unspecified site: Secondary | ICD-10-CM

## 2019-12-24 DIAGNOSIS — K219 Gastro-esophageal reflux disease without esophagitis: Secondary | ICD-10-CM | POA: Insufficient documentation

## 2019-12-24 DIAGNOSIS — R110 Nausea: Secondary | ICD-10-CM | POA: Insufficient documentation

## 2019-12-24 DIAGNOSIS — I82502 Chronic embolism and thrombosis of unspecified deep veins of left lower extremity: Secondary | ICD-10-CM | POA: Diagnosis not present

## 2019-12-24 DIAGNOSIS — Z1211 Encounter for screening for malignant neoplasm of colon: Secondary | ICD-10-CM | POA: Diagnosis not present

## 2019-12-24 DIAGNOSIS — I10 Essential (primary) hypertension: Secondary | ICD-10-CM | POA: Insufficient documentation

## 2019-12-24 DIAGNOSIS — K5900 Constipation, unspecified: Secondary | ICD-10-CM | POA: Insufficient documentation

## 2019-12-24 DIAGNOSIS — R630 Anorexia: Secondary | ICD-10-CM | POA: Diagnosis not present

## 2019-12-24 DIAGNOSIS — Z79899 Other long term (current) drug therapy: Secondary | ICD-10-CM | POA: Insufficient documentation

## 2019-12-24 DIAGNOSIS — E785 Hyperlipidemia, unspecified: Secondary | ICD-10-CM | POA: Diagnosis not present

## 2019-12-24 DIAGNOSIS — R5383 Other fatigue: Secondary | ICD-10-CM | POA: Insufficient documentation

## 2019-12-24 LAB — CEA (IN HOUSE-CHCC): CEA (CHCC-In House): 1.71 ng/mL (ref 0.00–5.00)

## 2019-12-24 MED ORDER — SENNOSIDES-DOCUSATE SODIUM 8.6-50 MG PO TABS: 2.0000 | ORAL_TABLET | Freq: Every day | ORAL | 0 refills | Status: DC

## 2019-12-24 MED ORDER — OXYCODONE HCL 5 MG PO TABS: 5.0000 mg | ORAL_TABLET | ORAL | 0 refills | Status: DC | PRN

## 2019-12-24 NOTE — Telephone Encounter (Signed)
Called the office of Deliah Goody, PA-C with [facility]. Explained that Dr Berline Lopes wants to cancel the MRI and I would call [facility] to cancel

## 2019-12-24 NOTE — Patient Instructions (Signed)
Preparing for your Surgery  Plan for surgery on January 06, 2020 with Dr. Jeral Pinch at [place] will be scheduled for a diagnostic laparoscopy to identify if the mass is coming from the uterus or ovary, robotic assisted vs open bilateral salpingo-oophorectomy, possible total laparoscopic hysterectomy if coming from the uterus, possible staging if cancer identified, possible laparotomy (open incision).   We will reach out to your primary care and your vein specialist to see if additional imaging is needed to re-evaluate the clot. We will contact you with updates in regards to having an IVC filter placed before surgery as well and when to stop the ELIQUIS.  Pre-operative Testing -You will receive a phone call from presurgical testing at [facility] to arrange for a pre-operative appointment over the phone, lab appointment, and COVID test. The COVID test normally happens 3 days prior to the surgery and they ask that you self quarantine after the test up until surgery to decrease chance of exposure.  -Bring your insurance card, copy of an advanced directive if applicable, medication list  -At that visit, you will be asked to sign a consent for a possible blood transfusion in case a transfusion becomes necessary during surgery.  The need for a blood transfusion is rare but having consent is a necessary part of your care.     -We will contact you about when to stop your ELIQUIS and if a bridge with lovenox is needed after stopping up until surgery.  -Do not take supplements such as fish oil (omega 3), red yeast rice, turmeric before your surgery.   Day Before Surgery at [place] will be asked to take in a light diet the day before surgery. You will be advised you can have clear liquids after midnight and up until 3 hours before your surgery.    Eat a light diet the day before surgery.  Examples including soups, broths, toast, yogurt, mashed potatoes.  AVOID GAS PRODUCING  FOODS. Things to avoid include carbonated beverages (fizzy beverages), raw fruits and raw vegetables, or beans.   If your bowels are filled with gas, your surgeon will have difficulty visualizing your pelvic organs which increases your surgical risks.  Your role in recovery Your role is to become active as soon as directed by your doctor, while still giving yourself time to heal.  Rest when you feel tired. You will be asked to do the following in order to speed your recovery:  - Cough and breathe deeply. This helps to clear and expand your lungs and can prevent pneumonia after surgery.  - [place]. Do mild physical activity. Walking or moving your legs help your circulation and body functions return to normal. Do not try to get up or walk alone the first time after surgery.   -If you develop swelling on one leg or the other, pain in the back of your leg, redness/warmth in one of your legs, please call the office or go to the Emergency Room to have a doppler to rule out a blood clot. For shortness of breath, chest pain-seek care in the Emergency Room as soon as possible. - Actively manage your pain. Managing your pain lets you move in comfort. We will ask you to rate your pain on a scale of zero to 10. It is your responsibility to tell your doctor or nurse where and how much you hurt so your pain can be treated.  Special Considerations -If you are  diabetic, you may be placed on insulin after surgery to have closer control over your blood sugars to promote healing and recovery.  This does not mean that you will be discharged on insulin.  If applicable, your oral antidiabetics will be resumed when you are tolerating a solid diet.  -Your final pathology results from surgery should be available around one week after surgery and the results will be relayed to you when available.  -Dr. Lahoma Crocker is the surgeon that assists your GYN Oncologist with surgery.  If you end up  staying the night, the next day after your surgery you will either see Dr. Denman George, Dr. Berline Lopes, or Dr. Lahoma Crocker.  -FMLA forms can be faxed to [phone] and please allow 5-7 business days for completion.  Pain Management After Surgery -You have been prescribed your pain medication and bowel regimen medications before surgery so that you can have these available when you are discharged from the hospital. The pain medication is for use ONLY AFTER surgery and a new prescription will not be given.   -Make sure that you have Tylenol and Ibuprofen at home to use on a regular basis after surgery for pain control. We recommend alternating the medications every hour to six hours since they work differently and are processed in the body differently for pain relief.  -Review the attached handout on narcotic use and their risks and side effects.   Bowel Regimen -You have been prescribed Sennakot-S to take nightly to prevent constipation especially if you are taking the narcotic pain medication intermittently.  It is important to prevent constipation and drink adequate amounts of liquids. You can stop taking this medication when you are not taking pain medication and you are back on your normal bowel routine.  Risks of Surgery Risks of surgery are low but include bleeding, infection, damage to surrounding structures, re-operation, blood clots, and very rarely death.   Blood Transfusion Information (For the consent to be signed before surgery)  We will be checking your blood type before surgery so in case of emergencies, we will know what type of blood you would need.                                            WHAT IS A BLOOD TRANSFUSION?  A transfusion is the replacement of blood or some of its parts. Blood is made up of multiple cells which provide different functions.  Red blood cells carry oxygen and are used for blood loss replacement.  White blood cells fight against  infection.  Platelets control bleeding.  Plasma helps clot blood.  Other blood products are available for specialized needs, such as hemophilia or other clotting disorders. BEFORE THE TRANSFUSION  Who gives blood for transfusions?   You may be able to donate blood to be used at a later date on yourself (autologous donation).  Relatives can be asked to donate blood. This is generally not any safer than if you have received blood from a stranger. The same precautions are taken to ensure safety when a relative's blood is donated.  Healthy volunteers who are fully evaluated to make sure their blood is safe. This is blood bank blood. Transfusion therapy is the safest it has ever been in the practice of medicine. Before blood is taken from a donor, a complete history is taken to make sure that person has no  history of diseases nor engages in risky social behavior (examples are intravenous drug use or sexual activity with multiple partners). The donor's travel history is screened to minimize risk of transmitting infections, such as malaria. The donated blood is tested for signs of infectious diseases, such as HIV and hepatitis. The blood is then tested to be sure it is compatible with you in order to minimize the chance of a transfusion reaction. If you or a relative donates blood, this is often done in anticipation of surgery and is not appropriate for emergency situations. It takes many days to process the donated blood. RISKS AND COMPLICATIONS Although transfusion therapy is very safe and saves many lives, the main dangers of transfusion include:   Getting an infectious disease.  Developing a transfusion reaction. This is an allergic reaction to something in the blood you were given. Every precaution is taken to prevent this. The decision to have a blood transfusion has been considered carefully by your caregiver before blood is given. Blood is not given unless the benefits outweigh the  risks.  AFTER SURGERY INSTRUCTIONS  Return to work: 4-6 weeks if applicable  Activity: 1. Be up and out of the bed during the day.  Take a nap if needed.  You may walk up steps but be careful and use the hand rail.  Stair climbing will tire you more than you think, you may need to stop part way and rest.   2. No lifting or straining for 6 weeks over 10 pounds. No pushing, pulling, straining for 6 weeks.  3. No driving for 1 week(s).  Do not drive if you are taking narcotic pain medicine and make sure that your reaction time has returned.   4. You can shower as soon as the next day after surgery. Shower daily.  Use soap and water on your incision and pat dry; don't rub.  No tub baths or submerging your body in water until cleared by your surgeon. If you have the soap that was given to you by pre-surgical testing that was used before surgery, you do not need to use it afterwards because this can irritate your incisions.   5. No sexual activity and nothing in the vagina for 8 weeks.  6. You may experience a small amount of clear drainage from your incisions, which is normal.  If the drainage persists, increases, or changes color please call the office.  7. Do not use creams, lotions, or ointments such as neosporin on your incisions after surgery until advised by your surgeon because they can cause removal of the dermabond glue on your incisions.    8. You may experience vaginal spotting after surgery or around the 6-8 week mark from surgery when the stitches at the top of the vagina begin to dissolve.  The spotting is normal but if you experience heavy bleeding, call our office.  9. Take Tylenol or ibuprofen first for pain and only use narcotic pain medication for severe pain not relieved by the Tylenol or Ibuprofen.  Monitor your Tylenol intake to a max of 4,000 mg in a 24 hour period. You can alternate these medications after surgery.  Diet: 1. Low sodium Heart Healthy Diet is  recommended.  2. It is safe to use a laxative, such as Miralax or Colace, if you have difficulty moving your bowels. You have been prescribed Sennakot at bedtime every evening to keep bowel movements regular and to prevent constipation.    Wound Care: 1. Keep clean and  dry.  Shower daily.  Reasons to call the Doctor:  Fever - Oral temperature greater than 100.4 degrees Fahrenheit  Foul-smelling vaginal discharge  Difficulty urinating  Nausea and vomiting  Increased pain at the site of the incision that is unrelieved with pain medicine.  Difficulty breathing with or without chest pain  New calf pain especially if only on one side  Sudden, continuing increased vaginal bleeding with or without clots.   Contacts: For questions or concerns you should contact:  Dr. Jeral Pinch at [phone]  Joylene John, NP at [phone]  After Hours: call [phone] and have the GYN Oncologist paged/contacted

## 2019-12-25 LAB — CA 125: Cancer Antigen (CA) 125: 124 U/mL — ABNORMAL HIGH (ref 0.0–38.1)

## 2019-12-28 ENCOUNTER — Other Ambulatory Visit: Payer: Self-pay

## 2019-12-29 ENCOUNTER — Telehealth: Payer: Self-pay | Admitting: Oncology

## 2019-12-29 NOTE — Telephone Encounter (Signed)
Left a message for Bethany Simon advising her that Dr. Berline Lopes has talked with Dr. Donzetta Matters. They are recommending that she proceed with the IVC filter placement before surgery and that Dr. Donzetta Matters will advise her when to stop Eliquis before surgery and will give her instructions on a Lovenox bridge.  Requested a return call to go over message.

## 2019-12-29 NOTE — Telephone Encounter (Signed)
Claressa called back and said Dr. Claretha Cooper office has called her and advised her to stop Eliquis before the IVC filter on 7/12 but did not discuss a Lovenox bridge before surgery.  Advised her to call back if Dr. Claretha Cooper office does not call her tomorrow and that we can reach out to his office for instructions.

## 2019-12-29 NOTE — Patient Instructions (Addendum)
DUE TO COVID-19 ONLY ONE VISITOR IS ALLOWED TO COME WITH YOU AND STAY IN THE WAITING ROOM ONLY DURING PRE OP AND PROCEDURE DAY OF SURGERY. THE 1 VISITOR MAY VISIT WITH YOU AFTER SURGERY IN YOUR PRIVATE ROOM DURING VISITING HOURS ONLY!  YOU NEED TO HAVE A COVID 19 TEST ON: 01/03/20 @ 9:00 am, THIS TEST MUST BE DONE BEFORE SURGERY, COME  [place], [place] , [id number].  ([name]) ONCE YOUR COVID TEST IS COMPLETED, PLEASE BEGIN THE QUARANTINE INSTRUCTIONS AS OUTLINED IN YOUR HANDOUT.                Bethany Simon   Your procedure is scheduled on: 01/06/20   Report to [facility] Main  Entrance   Report to admitting at: 10:45 AM     Call this number if you have problems the morning of surgery [phone]    Remember: Do not eat solid food :After Midnight. Clear liquids from midnight until: 9:45 am    CLEAR LIQUID DIET   Foods Allowed                                                                     Foods Excluded  Coffee and tea, regular and decaf                             liquids that you cannot  Plain Jell-O any favor except red or purple                                           see through such as: Fruit ices (not with fruit pulp)                                     milk, soups, orange juice  Iced Popsicles                                    All solid food Carbonated beverages, regular and diet                                    Cranberry, grape and apple juices Sports drinks like Gatorade Lightly seasoned clear broth or consume(fat free) Sugar, honey syrup  Sample Menu Breakfast                                Lunch                                     Supper Cranberry juice                    Beef broth  Chicken broth Jell-O                                     Grape juice                           Apple juice Coffee or tea                        Jell-O                                      Popsicle                                                 Coffee or tea                        Coffee or tea  _____________________________________________________________________    BRUSH YOUR TEETH MORNING OF SURGERY AND RINSE YOUR MOUTH OUT, NO CHEWING GUM CANDY OR MINTS.                                 You may not have any metal on your body including hair pins and              piercings  Do not wear jewelry, make-up, lotions, powders or perfumes, deodorant             Do not wear nail polish on your fingernails.  Do not shave  48 hours prior to surgery.               Do not bring valuables to the hospital. Bethany Simon.  Contacts, dentures or bridgework may not be worn into surgery.  Leave suitcase in the car. After surgery it may be brought to your room.     Patients discharged the day of surgery will not be allowed to drive home. IF YOU ARE HAVING SURGERY AND GOING HOME THE SAME DAY, YOU MUST HAVE AN ADULT TO DRIVE YOU HOME AND BE WITH YOU FOR 24 HOURS. YOU MAY GO HOME BY TAXI OR UBER OR ORTHERWISE, BUT AN ADULT MUST ACCOMPANY YOU HOME AND STAY WITH YOU FOR 24 HOURS.  Name and phone number of your driver:  Special Instructions: N/A              Please read over the following fact sheets you were given: _____________________________________________________________________         [facility] - Preparing for Surgery Before surgery, you can play an important role.  Because skin is not sterile, your skin needs to be as free of germs as possible.  You can reduce the number of germs on your skin by washing with CHG (chlorahexidine gluconate) soap before surgery.  CHG is an antiseptic cleaner which kills germs and bonds with the skin to continue killing germs even after washing. Please DO NOT use if you have an allergy to CHG or antibacterial soaps.  If your skin becomes reddened/irritated stop using the CHG and inform your nurse when you arrive at Short Stay. Do  not shave (including legs and underarms) for at least 48 hours prior to the first CHG shower.  You may shave your face/neck. Please follow these instructions carefully:  1.  Shower with CHG Soap the night before surgery and the  morning of Surgery.  2.  If you choose to wash your hair, wash your hair first as usual with your  normal  shampoo.  3.  After you shampoo, rinse your hair and body thoroughly to remove the  shampoo.                           4.  Use CHG as you would any other liquid soap.  You can apply chg directly  to the skin and wash                       Gently with a scrungie or clean washcloth.  5.  Apply the CHG Soap to your body ONLY FROM THE NECK DOWN.   Do not use on face/ open                           Wound or open sores. Avoid contact with eyes, ears mouth and genitals (private parts).                       Wash face,  Genitals (private parts) with your normal soap.             6.  Wash thoroughly, paying special attention to the area where your surgery  will be performed.  7.  Thoroughly rinse your body with warm water from the neck down.  8.  DO NOT shower/wash with your normal soap after using and rinsing off  the CHG Soap.                9.  Pat yourself dry with a clean towel.            10.  Wear clean pajamas.            11.  Place clean sheets on your bed the night of your first shower and do not  sleep with pets. Day of Surgery : Do not apply any lotions/deodorants the morning of surgery.  Please wear clean clothes to the hospital/surgery center.  FAILURE TO FOLLOW THESE INSTRUCTIONS MAY RESULT IN THE CANCELLATION OF YOUR SURGERY PATIENT SIGNATURE_________________________________  NURSE SIGNATURE__________________________________  ________________________________________________________________________   Bethany Simon  An incentive spirometer is a tool that can help keep your lungs clear and active. This tool measures how well you are filling your  lungs with each breath. Taking long deep breaths may help reverse or decrease the chance of developing breathing (pulmonary) problems (especially infection) following:  A long period of time when you are unable to move or be active. BEFORE THE PROCEDURE   If the spirometer includes an indicator to show your best effort, your nurse or respiratory therapist will set it to a desired goal.  If possible, sit up straight or lean slightly forward. Try not to slouch.  Hold the incentive spirometer in an upright position. INSTRUCTIONS FOR USE  1. Sit on the edge of your bed if possible, or sit up as far as you can in bed or on  a chair. 2. Hold the incentive spirometer in an upright position. 3. Breathe out normally. 4. Place the mouthpiece in your mouth and seal your lips tightly around it. 5. Breathe in slowly and as deeply as possible, raising the piston or the ball toward the top of the column. 6. Hold your breath for 3-5 seconds or for as long as possible. Allow the piston or ball to fall to the bottom of the column. 7. Remove the mouthpiece from your mouth and breathe out normally. 8. Rest for a few seconds and repeat Steps 1 through 7 at least 10 times every 1-2 hours when you are awake. Take your time and take a few normal breaths between deep breaths. 9. The spirometer may include an indicator to show your best effort. Use the indicator as a goal to work toward during each repetition. 10. After each set of 10 deep breaths, practice coughing to be sure your lungs are clear. If you have an incision (the cut made at the time of surgery), support your incision when coughing by placing a pillow or rolled up towels firmly against it. Once you are able to get out of bed, walk around indoors and cough well. You may stop using the incentive spirometer when instructed by your caregiver.  RISKS AND COMPLICATIONS  Take your time so you do not get dizzy or light-headed.  If you are in pain, you may need  to take or ask for pain medication before doing incentive spirometry. It is harder to take a deep breath if you are having pain. AFTER USE  Rest and breathe slowly and easily.  It can be helpful to keep track of a log of your progress. Your caregiver can provide you with a simple table to help with this. If you are using the spirometer at home, follow these instructions: [place] IF:   You are having difficultly using the spirometer.  You have trouble using the spirometer as often as instructed.  Your pain medication is not giving enough relief while using the spirometer.  You develop fever of 100.5 F (38.1 C) or higher. SEEK IMMEDIATE MEDICAL CARE IF:   You cough up bloody sputum that had not been present before.  You develop fever of 102 F (38.9 C) or greater.  You develop worsening pain at or near the incision site. MAKE SURE YOU:   Understand these instructions.  Will watch your condition.  Will get help right away if you are not doing well or get worse. Document Released: 10/21/2006 Document Revised: 09/02/2011 Document Reviewed: 12/22/2006 [facility] Patient Information 2014 [place], [place].   ________________________________________________________________________

## 2019-12-30 ENCOUNTER — Encounter (HOSPITAL_COMMUNITY): Payer: Self-pay

## 2019-12-30 ENCOUNTER — Other Ambulatory Visit: Payer: Self-pay

## 2019-12-30 ENCOUNTER — Encounter (HOSPITAL_COMMUNITY)
Admission: RE | Admit: 2019-12-30 | Discharge: 2019-12-30 | Disposition: A | Discharge status: Home / Self Care | Payer: Medicare PPO | Source: Ambulatory Visit | Attending: Gynecologic Oncology | Admitting: Gynecologic Oncology

## 2019-12-30 ENCOUNTER — Telehealth: Payer: Self-pay

## 2019-12-30 DIAGNOSIS — Z01818 Encounter for other preprocedural examination: Secondary | ICD-10-CM | POA: Diagnosis not present

## 2019-12-30 HISTORY — DX: Nausea with vomiting, unspecified: R11.2

## 2019-12-30 HISTORY — DX: Unspecified osteoarthritis, unspecified site: M19.90

## 2019-12-30 HISTORY — DX: Other complications of anesthesia, initial encounter: T88.59XA

## 2019-12-30 LAB — CBC
HCT: 38.7 % (ref 36.0–46.0)
Hemoglobin: 12 g/dL (ref 12.0–15.0)
MCH: 28.8 pg (ref 26.0–34.0)
MCHC: 31 g/dL (ref 30.0–36.0)
MCV: 92.8 fL (ref 80.0–100.0)
Platelets: 368 10*3/uL (ref 150–400)
RBC: 4.17 MIL/uL (ref 3.87–5.11)
RDW: 13.4 % (ref 11.5–15.5)
WBC: 12.7 10*3/uL — ABNORMAL HIGH (ref 4.0–10.5)
nRBC: 0 % (ref 0.0–0.2)

## 2019-12-30 LAB — COMPREHENSIVE METABOLIC PANEL
ALT: 19 U/L (ref 0–44)
AST: 17 U/L (ref 15–41)
Albumin: 3.5 g/dL (ref 3.5–5.0)
Alkaline Phosphatase: 83 U/L (ref 38–126)
Anion gap: 10 (ref 5–15)
BUN: 14 mg/dL (ref 8–23)
CO2: 29 mmol/L (ref 22–32)
Calcium: 9.5 mg/dL (ref 8.9–10.3)
Chloride: 99 mmol/L (ref 98–111)
Creatinine, Ser: 0.82 mg/dL (ref 0.44–1.00)
GFR calc Af Amer: >60 mL/min (ref >60)
GFR calc non Af Amer: >60 mL/min (ref >60)
Glucose, Bld: 138 mg/dL — ABNORMAL HIGH (ref 70–99)
Potassium: 4.9 mmol/L (ref 3.5–5.1)
Sodium: 138 mmol/L (ref 135–145)
Total Bilirubin: 0.5 mg/dL (ref 0.3–1.2)
Total Protein: 7.5 g/dL (ref 6.5–8.1)

## 2019-12-30 LAB — URINALYSIS, ROUTINE W REFLEX MICROSCOPIC
Bilirubin Urine: NEGATIVE
Glucose, UA: NEGATIVE mg/dL
Hgb urine dipstick: NEGATIVE
Ketones, ur: NEGATIVE mg/dL
Nitrite: NEGATIVE
Protein, ur: 30 mg/dL — AB
Specific Gravity, Urine: 1.023 (ref 1.005–1.030)
pH: 5 (ref 5.0–8.0)

## 2019-12-30 NOTE — Telephone Encounter (Signed)
Told Bethany Simon that the CEA was WNL at 1.71. The CA-125 was elevated at 124. The elevated level does not confirm cancer but does raise suspicion for cancer. Her WBC is elevated at 12.7 today on pre-op labs. Pt denies andy fever,chills,cough,diarrhea, or urinary symptoms. Told her U/A looked fine but a culture was sent to check for UTI per Bethany Cross,NP.  Pt has not heard from Bethany Simon office in regards to a lovenox bridge for surgery. Gave this information to Bethany Simon Nurse Navigator  to f/u with his office and notify pt of recommendations.  Bethany Simon also stated that there was a questions as to weather she needed a covid test as she has been vaccinated.  Pre surgical testing nurse told patient that it was up to Bethany Simon even though the PST protocol is to have one.  Bethany will call Bethany Simon in presurgical testing to verify.  Will notify patient if testing is needed as scheduled on 01-03-20 at 0915.

## 2019-12-30 NOTE — Progress Notes (Addendum)
COVID Vaccine Completed:yes Date COVID Vaccine completed: 08/06/19 COVID vaccine manufacturer: *[place]   PCP - Dr. Harlan Stains Cardiologist - Vascular: Dr. Alice Rieger. Clearance: 12/28/19. Chart.  Chest x-ray -  EKG -  Stress Test -  ECHO -  Cardiac Cath -   Sleep Study -  CPAP -   Fasting Blood Sugar -  Checks Blood Sugar _____ times a day  Blood Thinner Instructions: Eliquis will be hold on 01/03/20 as per Dr. Vella Redhead. Aspirin Instructions: Last Dose:  Anesthesia review:   Patient denies shortness of breath, fever, cough and chest pain at PAT appointment   Patient verbalized understanding of instructions that were given to them at the PAT appointment. Patient was also instructed that they will need to review over the PAT instructions again at home before surgery.

## 2019-12-30 NOTE — Telephone Encounter (Signed)
Called Dr. Claretha Cooper office for instructions for holding Eliquis between IVC filter placement and surgery on 7/15 as well as Lovenox bridge instructions.  Spoke to [employer] in nursing. She said Dr. Donzetta Matters is out today so they will call back tomorrow with instructions.  Also called Dachelle and let her know that we will call her back tomorrow.

## 2019-12-30 NOTE — Progress Notes (Signed)
Covid test was schedule for 01/03/20.As per PST protocol. Pt. Will call Dr. Charisse March office to confirmed if they still requiring patients to have the test before surgery.

## 2019-12-31 ENCOUNTER — Telehealth: Payer: Self-pay | Admitting: Oncology

## 2019-12-31 ENCOUNTER — Telehealth: Payer: Self-pay

## 2019-12-31 ENCOUNTER — Other Ambulatory Visit: Payer: Self-pay | Admitting: Gynecologic Oncology

## 2019-12-31 ENCOUNTER — Encounter (HOSPITAL_COMMUNITY): Payer: Self-pay | Admitting: Physician Assistant

## 2019-12-31 DIAGNOSIS — I82502 Chronic embolism and thrombosis of unspecified deep veins of left lower extremity: Secondary | ICD-10-CM

## 2019-12-31 DIAGNOSIS — R1900 Intra-abdominal and pelvic swelling, mass and lump, unspecified site: Secondary | ICD-10-CM

## 2019-12-31 LAB — URINE CULTURE

## 2019-12-31 MED ORDER — ENOXAPARIN SODIUM 100 MG/ML ~~LOC~~ SOLN: 1.0000 mg/kg | Freq: Two times a day (BID) | SUBCUTANEOUS | 0 refills | Status: DC

## 2019-12-31 NOTE — Progress Notes (Signed)
Pre-op lovenox bridge prescribed per Dr. Claretha Cooper recommendations.

## 2019-12-31 NOTE — Telephone Encounter (Signed)
Bethany Simon Nurse Navigator will inform [name] that she needs to have the covid test prior to surgery as it remains PST protocol even for vaccinated patients and will inform her of the Lovenox Bridge when she obtains information from Dr. Claretha Cooper office today 12-31-19

## 2019-12-31 NOTE — Progress Notes (Signed)
Anesthesia Chart Review   Case: [id number] Date/Time: 01/06/20 1130   Procedure: XI ROBOTIC ASSISTED TOTAL HYSTERECTOMY WITH BILATERAL SALPINGO OOPHORECTOMY POSSIBLE STAGING, POSSIBLE LAPAROTOMY (Bilateral )   Anesthesia type: General   Pre-op diagnosis: PELVIC MASS   Location: [facility] ROOM 02 / [facility]   Surgeons: [name], MD      DISCUSSION:69 y.o. former smoker (quit 06/24/77) with h/o PONV, HTN, HLD, GERD, May-Thurner syndrome, DVT, pelvic mass scheduled for above procedure 01/06/2020 with Dr. Jeral Pinch.   Recent left lower extremity DVT 10/07/2019.  Followed by Vascular surgeon Dr. Donzetta Matters.  On Eliquis.  Pt is scheduled for IVC filter 01/03/2020 with Dr. Donzetta Matters prior to procedure with Dr. Berline Lopes.    Pt to hold Eliquis, last dose 01/02/20.  Will start Lovenox bridge 01/04/20.   Anticipate pt can proceed with planned procedure barring acute status change.   VS: There were no vitals taken for this visit.  PROVIDERS: Harlan Stains, MD is PCP   [name], MD is Vascular Surgeon  LABS: Labs reviewed: Acceptable for surgery. (all labs ordered are listed, but only abnormal results are displayed)  Labs Reviewed  URINE CULTURE - Abnormal; Notable for the following components:      Result Value   Culture MULTIPLE SPECIES PRESENT, SUGGEST RECOLLECTION (*)    All other components within normal limits     IMAGES: CT Abdomen 12/11/2019 IMPRESSION: 1. Heterogeneous mass within the pelvis with apparent areas of cystic or necrotic changes concerning for malignancy. Further evaluation with MRI without and with contrast is recommended. 2. No bowel obstruction. 3. Compression of the left common iliac vein between the right common iliac artery and spine as seen on the prior CT. 4. Aortic Atherosclerosis (ICD10-I70.0).  EKG: 12/30/2019 Rate 97 bpm  NSR  CV:  Past Medical History:  Diagnosis Date   Arthritis    Complication of anesthesia    DVT (deep venous  thrombosis) (HCC)    GERD (gastroesophageal reflux disease)    Hyperlipidemia    Hypertension    May-Thurner syndrome    Osteopenia    Pelvic mass    PONV (postoperative nausea and vomiting)    SCC (squamous cell carcinoma)    skin    Past Surgical History:  Procedure Laterality Date   BREAST ENHANCEMENT SURGERY     BREAST SURGERY     CATARACT EXTRACTION Bilateral 2016   LEEP  2000    MEDICATIONS: No current facility-administered medications for this encounter.    acetaminophen (TYLENOL) 500 MG tablet   apixaban (ELIQUIS) 5 MG TABS tablet   atorvastatin (LIPITOR) 20 MG tablet   lisinopril (ZESTRIL) 10 MG tablet   ondansetron (ZOFRAN-ODT) 8 MG disintegrating tablet   Probiotic Product (ALIGN) 4 MG CAPS   RESTASIS 0.05 % ophthalmic emulsion   [START ON 01/04/2020] enoxaparin (LOVENOX) 100 MG/ML injection   oxyCODONE (OXY IR/ROXICODONE) 5 MG immediate release tablet   senna-docusate (SENOKOT-S) 8.6-50 MG tablet    Bethany Simon [facility]Surgical Testing [phone] 12/31/19  1:44 PM

## 2019-12-31 NOTE — Telephone Encounter (Signed)
Received instructions for holding Eliquis and Lovenox bridge before 01/06/20 surgery from [facility], RN at Dr. Claretha Cooper office.  Called [place] and advised her to hold Eliquis after 01/02/20 evening dose until after surgery when it is safe to restart per Dr. Berline Lopes.  Also discussed Lovenox injections twice a day starting the morning of 01/04/20 and ending the evening of 01/05/20. She said her husband knows how to give Lovenox injections so she does not need education.  Advised her to call  if [facility] does not have the injections in stock and if she needs assistance with the dosage.  She verbalized understanding of instructions.  Also advised her of urine culture results which showed multiple species present.  She denies having any dysuria or urinary symptoms.  Advised her nothing further needs to be done.  Discussed that she will need Covid testing before surgery.  She has an appointment on Monday, 01/03/20 at 9:15 am.    She would also like Dr. Berline Lopes to contact her daughter, Wannetta Sender after surgery.

## 2019-12-31 NOTE — Telephone Encounter (Signed)
Spoke with Santiago Glad at [facility]/ Dr. Charisse March office regarding recommendations for lovenox bridging instructions. Advised per Dr. Donzetta Matters, pt should take Lovenox 1mg /kg BID 48 hours prior to procedure, hold Eliquis and restart when safe from a surgical standpoint. Voiced understanding.

## 2020-01-03 ENCOUNTER — Encounter (HOSPITAL_COMMUNITY)
Admission: RE | Disposition: A | Discharge status: Home / Self Care | Payer: Self-pay | Source: Home / Self Care | Attending: Vascular Surgery

## 2020-01-03 ENCOUNTER — Encounter (HOSPITAL_COMMUNITY): Payer: Self-pay | Admitting: Vascular Surgery

## 2020-01-03 ENCOUNTER — Ambulatory Visit (HOSPITAL_COMMUNITY)
Admission: RE | Admit: 2020-01-03 | Discharge: 2020-01-03 | Disposition: A | Discharge status: Home / Self Care | Payer: Medicare PPO | Attending: Vascular Surgery | Admitting: Vascular Surgery

## 2020-01-03 ENCOUNTER — Other Ambulatory Visit (HOSPITAL_COMMUNITY)
Admission: RE | Admit: 2020-01-03 | Discharge: 2020-01-03 | Disposition: A | Discharge status: Home / Self Care | Payer: Medicare PPO | Source: Ambulatory Visit | Attending: Gynecologic Oncology | Admitting: Gynecologic Oncology

## 2020-01-03 ENCOUNTER — Other Ambulatory Visit: Payer: Self-pay

## 2020-01-03 ENCOUNTER — Other Ambulatory Visit (HOSPITAL_COMMUNITY): Payer: Self-pay | Admitting: *Deleted

## 2020-01-03 DIAGNOSIS — E785 Hyperlipidemia, unspecified: Secondary | ICD-10-CM | POA: Insufficient documentation

## 2020-01-03 DIAGNOSIS — Z87891 Personal history of nicotine dependence: Secondary | ICD-10-CM | POA: Diagnosis not present

## 2020-01-03 DIAGNOSIS — Z20822 Contact with and (suspected) exposure to covid-19: Secondary | ICD-10-CM | POA: Diagnosis not present

## 2020-01-03 DIAGNOSIS — Z79899 Other long term (current) drug therapy: Secondary | ICD-10-CM | POA: Diagnosis not present

## 2020-01-03 DIAGNOSIS — Z7901 Long term (current) use of anticoagulants: Secondary | ICD-10-CM | POA: Insufficient documentation

## 2020-01-03 DIAGNOSIS — Z86718 Personal history of other venous thrombosis and embolism: Secondary | ICD-10-CM | POA: Diagnosis not present

## 2020-01-03 DIAGNOSIS — I82522 Chronic embolism and thrombosis of left iliac vein: Secondary | ICD-10-CM | POA: Diagnosis not present

## 2020-01-03 HISTORY — PX: IVC FILTER INSERTION: CATH118245

## 2020-01-03 HISTORY — PX: IVC VENOGRAPHY: CATH118301

## 2020-01-03 LAB — SARS CORONAVIRUS 2 (TAT 6-24 HRS): SARS Coronavirus 2: NEGATIVE

## 2020-01-03 SURGERY — IVC FILTER INSERTION
Anesthesia: LOCAL

## 2020-01-03 MED ORDER — FENTANYL CITRATE (PF) 100 MCG/2ML IJ SOLN
INTRAMUSCULAR | Status: DC | PRN
  Administered 2020-01-03: 50 ug via INTRAVENOUS

## 2020-01-03 MED ORDER — SODIUM CHLORIDE 0.9% FLUSH: 3.0000 mL | INTRAVENOUS | Status: DC | PRN

## 2020-01-03 MED ORDER — ACETAMINOPHEN 325 MG PO TABS: 650.0000 mg | ORAL_TABLET | ORAL | Status: DC | PRN

## 2020-01-03 MED ORDER — LABETALOL HCL 5 MG/ML IV SOLN: 10.0000 mg | INTRAVENOUS | Status: DC | PRN

## 2020-01-03 MED ORDER — OXYCODONE HCL 5 MG PO TABS: 5.0000 mg | ORAL_TABLET | ORAL | Status: DC | PRN

## 2020-01-03 MED ORDER — FENTANYL CITRATE (PF) 100 MCG/2ML IJ SOLN
INTRAMUSCULAR | Status: AC
  Filled 2020-01-03: qty 2

## 2020-01-03 MED ORDER — ONDANSETRON HCL 4 MG/2ML IJ SOLN: 4.0000 mg | Freq: Four times a day (QID) | INTRAMUSCULAR | Status: DC | PRN

## 2020-01-03 MED ORDER — MIDAZOLAM HCL 2 MG/2ML IJ SOLN
INTRAMUSCULAR | Status: AC
  Filled 2020-01-03: qty 2

## 2020-01-03 MED ORDER — MIDAZOLAM HCL 2 MG/2ML IJ SOLN
INTRAMUSCULAR | Status: DC | PRN
  Administered 2020-01-03: 1 mg via INTRAVENOUS

## 2020-01-03 MED ORDER — LIDOCAINE HCL (PF) 1 % IJ SOLN
INTRAMUSCULAR | Status: AC
  Filled 2020-01-03: qty 30

## 2020-01-03 MED ORDER — HYDRALAZINE HCL 20 MG/ML IJ SOLN: 5.0000 mg | INTRAMUSCULAR | Status: DC | PRN

## 2020-01-03 MED ORDER — SODIUM CHLORIDE 0.9 % IV SOLN: INTRAVENOUS | Status: DC

## 2020-01-03 MED ORDER — HEPARIN (PORCINE) IN NACL 1000-0.9 UT/500ML-% IV SOLN
INTRAVENOUS | Status: DC | PRN
  Administered 2020-01-03: 500 mL

## 2020-01-03 MED ORDER — MORPHINE SULFATE (PF) 2 MG/ML IV SOLN: 2.0000 mg | INTRAVENOUS | Status: DC | PRN

## 2020-01-03 MED ORDER — LIDOCAINE HCL (PF) 1 % IJ SOLN
INTRAMUSCULAR | Status: DC | PRN
  Administered 2020-01-03: 15 mL

## 2020-01-03 MED ORDER — SODIUM CHLORIDE 0.9 % IV SOLN: 250.0000 mL | INTRAVENOUS | Status: DC | PRN

## 2020-01-03 MED ORDER — SODIUM CHLORIDE 0.9% FLUSH: 3.0000 mL | Freq: Two times a day (BID) | INTRAVENOUS | Status: DC

## 2020-01-03 MED ORDER — IODIXANOL 320 MG/ML IV SOLN
INTRAVENOUS | Status: DC | PRN
  Administered 2020-01-03: 35 mL

## 2020-01-03 MED ORDER — HEPARIN (PORCINE) IN NACL 1000-0.9 UT/500ML-% IV SOLN
INTRAVENOUS | Status: AC
  Filled 2020-01-03: qty 500

## 2020-01-03 SURGICAL SUPPLY — 7 items
FILTER VC CELECT-FEMORAL (Filter) ×1 IMPLANT
KIT MICROPUNCTURE NIT STIFF (SHEATH) ×1 IMPLANT
SHEATH PROBE COVER 6X72 (BAG) ×1 IMPLANT
SYR MEDRAD MARK 7 150ML (SYRINGE) ×2 IMPLANT
TRAY PV CATH (CUSTOM PROCEDURE TRAY) ×2 IMPLANT
TUBING INJECTOR 48 (MISCELLANEOUS) ×1 IMPLANT
WIRE BENTSON .035X145CM (WIRE) ×1 IMPLANT

## 2020-01-03 NOTE — H&P (Signed)
HPI:  Bethany Simon is a 69 y.o. female and extensive left lower extremity DVT in April.  After further discussion we elected to undergo CT venogram.  She now follows up for further evaluation and review of her CT.  She does remain on Eliquis.  She denies any lower extremity swelling at this time.  Past Medical History:  Diagnosis Date  . DVT (deep venous thrombosis) ([place])   . Hyperlipidemia    History reviewed. No pertinent family history.      Past Surgical History:  Procedure Laterality Date  . BREAST ENHANCEMENT SURGERY      Short Social History:  Social History        Tobacco Use  . Smoking status: Former Research scientist (life sciences)  . Smokeless tobacco: Never Used  Substance Use Topics  . Alcohol use: Yes    Comment: social         Allergies  Allergen Reactions  . Atropine     convulsion          Current Outpatient Medications  Medication Sig Dispense Refill  . apixaban (ELIQUIS) 5 MG TABS tablet Take 2 tablets (10mg ) twice daily for 7 days, then 1 tablet (5mg ) twice daily 60 tablet 0  . atorvastatin (LIPITOR) 20 MG tablet Take 20 mg by mouth daily.    Marland Kitchen lisinopril (ZESTRIL) 10 MG tablet Take 10 mg by mouth daily.    . RESTASIS 0.05 % ophthalmic emulsion Place 1 drop into both eyes 2 (two) times daily.     No current facility-administered medications for this visit.    Review of Systems  Constitutional:  Constitutional negative. HENT: HENT negative.  Eyes: Eyes negative.  Respiratory: Respiratory negative.  Cardiovascular: Cardiovascular negative.  GI: Gastrointestinal negative.  Musculoskeletal: Musculoskeletal negative.  Skin: Skin negative.  Neurological: Neurological negative. Hematologic: Hematologic/lymphatic negative.  Psychiatric: Psychiatric negative.        Objective:   Vitals:   01/03/20 0941  BP: 133/66  Pulse: (!) 112  Resp: 14  SpO2: 98%     Physical Exam HENT:     Head: Normocephalic.     Nose:       Comments: Mask in place Pulmonary:     Effort: Pulmonary effort is normal.  Abdominal:     General: Abdomen is flat.     Palpations: Abdomen is soft. There is no mass.  Musculoskeletal:        General: Normal range of motion.     Cervical back: Normal range of motion and neck supple.     Right lower leg: No edema.     Left lower leg: No edema.  Skin:    General: Skin is warm.     Capillary Refill: Capillary refill takes less than 2 seconds.  Neurological:     General: No focal deficit present.     Mental Status: She is alert.  Psychiatric:        Mood and Affect: Mood normal.        Thought Content: Thought content normal.        Judgment: Judgment normal.     Data: CT venogram IMPRESSION: 1. 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 Findings are suggestive for May-Thurner syndrome. The other abdominal and pelvic venous structures are patent without evidence of acute deep venous thrombosis. 2. An incidental finding of potential clinical significance has been found. Unusual appearance of the uterus and right adnexa. Findings could be related  to uterine fibroid disease but indeterminate. In addition, there are appears to be a right adnexal cyst which would be atypical for a patient of this age.  3. 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 4. Aortic Atherosclerosis (ICD10-I70.0).      Assessment/Plan:     69 year old female with apparent May Thurner syndrome recent large left lower extremity DVT.  Swelling has resolved. Planning for surgery later this week and will place ivc filter today.     [name] MD [facility]

## 2020-01-03 NOTE — Progress Notes (Signed)
Patient and daughter was given discharge instructions. Both verbalized understanding. 

## 2020-01-03 NOTE — Discharge Instructions (Signed)
Femoral Site Care This sheet gives you information about how to care for yourself after your procedure. Your health care provider may also give you more specific instructions. If you have problems or questions, contact your health care provider. What can I expect after the procedure? After the procedure, it is common to have:  Bruising that usually fades within 1-2 weeks.  Tenderness at the site. Follow these instructions at home: Wound care  Follow instructions from your health care provider about how to take care of your insertion site. Make sure you: ? Wash your hands with soap and water before you change your bandage (dressing). If soap and water are not available, use hand sanitizer. ? Change your dressing as told by your health care provider. ? Leave stitches (sutures), skin glue, or adhesive strips in place. These skin closures may need to stay in place for 2 weeks or longer. If adhesive strip edges start to loosen and curl up, you may trim the loose edges. Do not remove adhesive strips completely unless your health care provider tells you to do that.  Do not take baths, swim, or use a hot tub until your health care provider approves.  You may shower 24-48 hours after the procedure or as told by your health care provider. ? Gently wash the site with plain soap and water. ? Pat the area dry with a clean towel. ? Do not rub the site. This may cause bleeding.  Do not apply powder or lotion to the site. Keep the site clean and dry.  Check your femoral site every day for signs of infection. Check for: ? Redness, swelling, or pain. ? Fluid or blood. ? Warmth. ? Pus or a bad smell. Activity  For the first 2-3 days after your procedure, or as long as directed: ? Avoid climbing stairs as much as possible. ? Do not squat.  Do not lift anything that is heavier than 10 lb (4.5 kg), or the limit that you are told, until your health care provider says that it is safe.  Rest as  directed. ? Avoid sitting for a long time without moving. Get up to take short walks every 1-2 hours.  Do not drive for 24 hours if you were given a medicine to help you relax (sedative). General instructions  Take over-the-counter and prescription medicines only as told by your health care provider.  Keep all follow-up visits as told by your health care provider. This is important. Contact a health care provider if you have:  A fever or chills.  You have redness, swelling, or pain around your insertion site. Get help right away if:  The catheter insertion area swells very fast.  You pass out.  You suddenly start to sweat or your skin gets clammy.  The catheter insertion area is bleeding, and the bleeding does not stop when you hold steady pressure on the area.  The area near or just beyond the catheter insertion site becomes pale, cool, tingly, or numb. These symptoms may represent a serious problem that is an emergency. Do not wait to see if the symptoms will go away. Get medical help right away. Call your local emergency services (911 in the [place] Do not drive yourself to the hospital. Summary  After the procedure, it is common to have bruising that usually fades within 1-2 weeks.  Check your femoral site every day for signs of infection.  Do not lift anything that is heavier than 10 lb (4.5 kg), or the   limit that you are told, until your health care provider says that it is safe. This information is not intended to replace advice given to you by your health care provider. Make sure you discuss any questions you have with your health care provider. Document Revised: 06/23/2017 Document Reviewed: 06/23/2017 Elsevier Patient Education  2020 Elsevier Inc.  

## 2020-01-03 NOTE — Op Note (Signed)
    Patient name: Bethany Simon MRN: [id number] DOB: 1951-03-06 Sex: female  01/03/2020 Pre-operative Diagnosis: DVT Post-operative diagnosis:  Same Surgeon:  Erlene Quan C. Donzetta Matters, MD Procedure Performed: 1.  Ultrasound-guided cannulation right common femoral vein 2.  IVC venogram 3.  Placement of Cook select infrarenal IVC filter 4.  Moderate sedation with fentanyl Versed for 7 minutes  Indications: 69 year old female with history of DVT I was quite extensive in her left lower extremity occurred 3 months ago.  After CT venogram her swelling did completely resolved.  She remains on Eliquis is now scheduled for surgery and is indicated for IVC filter placement.  Findings: The IVC itself measured less than 20 mm.  Filter was placed in the infrarenal position.  We will plan filter removal within 3 months.   Procedure:  The patient was identified in the holding area and taken to room 8.  The patient was then placed supine on the table and prepped and draped in the usual sterile fashion.  A time out was called.  Ultrasound was used to evaluate the right common femoral vein.  It was noted to be patent and compressible.  There is anesthetized 1% lidocaine.  Moderate sedation with fentanyl Versed was administered.  We cannulated the common femoral vein with micropuncture needle followed the wire and sheath with direct ultrasound visualization.  And images saved the permanent record.  A Bentson wire was placed.  The introducer sheath was then placed over the wire.  Central venography was performed.  We then placed a filter in the infrarenal position.  Introducer sheath was removed and pressure was held.  She tolerated procedure without any complication.  Contrast: 35cc   Avyay Coger C. Donzetta Matters, MD [facility] [place] Office: [phone] Pager: [phone]

## 2020-01-05 ENCOUNTER — Encounter (HOSPITAL_COMMUNITY): Payer: Self-pay | Admitting: Vascular Surgery

## 2020-01-05 ENCOUNTER — Telehealth: Payer: Self-pay

## 2020-01-05 NOTE — Telephone Encounter (Signed)
Ms Agresti told to arrive at [place] tomorrow  for surgery, Pt states she understands her written instructions. She will take last dose of Lovenox this evening. Stopped Eliquis after 01-02-20 dose.  Ms Bradway states that the last two days she has had a lot of gas. Not able to or drink fluids well. She was constipated but has resolved.Her HR increases after walking or doing a task. She can sit down and HR slows down. No CP or SOB. She does not think she can take both showers with CHG.  Pt would like to be admitted tonoght to be ready for tomorrow. Reviewed with Joylene John, NP. Pt will not be admitted this evening as it is better for her to have the minimal amount of exposure to the hospital as possible.  Told her the recommendation to decrease the chance of infection with major surgery is the two showers. Suggested a shower chair and eating jello, popsicles, anything that melts is fluid. Pt will do the best she can.

## 2020-01-06 ENCOUNTER — Other Ambulatory Visit: Payer: Self-pay

## 2020-01-06 ENCOUNTER — Ambulatory Visit (HOSPITAL_COMMUNITY): Payer: Medicare PPO | Admitting: Physician Assistant

## 2020-01-06 ENCOUNTER — Ambulatory Visit (HOSPITAL_COMMUNITY)
Admission: RE | Admit: 2020-01-06 | Discharge: 2020-01-07 | Disposition: A | Discharge status: Home / Self Care | Payer: Medicare PPO | Attending: Gynecologic Oncology | Admitting: Gynecologic Oncology

## 2020-01-06 ENCOUNTER — Encounter (HOSPITAL_COMMUNITY)
Admission: RE | Disposition: A | Discharge status: Home / Self Care | Payer: Self-pay | Source: Home / Self Care | Attending: Gynecologic Oncology

## 2020-01-06 ENCOUNTER — Encounter (HOSPITAL_COMMUNITY): Payer: Self-pay | Admitting: Gynecologic Oncology

## 2020-01-06 DIAGNOSIS — N809 Endometriosis, unspecified: Secondary | ICD-10-CM

## 2020-01-06 DIAGNOSIS — Z86718 Personal history of other venous thrombosis and embolism: Secondary | ICD-10-CM | POA: Diagnosis not present

## 2020-01-06 DIAGNOSIS — Z85828 Personal history of other malignant neoplasm of skin: Secondary | ICD-10-CM | POA: Insufficient documentation

## 2020-01-06 DIAGNOSIS — N803 Endometriosis of pelvic peritoneum: Secondary | ICD-10-CM | POA: Diagnosis not present

## 2020-01-06 DIAGNOSIS — M1990 Unspecified osteoarthritis, unspecified site: Secondary | ICD-10-CM | POA: Insufficient documentation

## 2020-01-06 DIAGNOSIS — N736 Female pelvic peritoneal adhesions (postinfective): Secondary | ICD-10-CM | POA: Diagnosis not present

## 2020-01-06 DIAGNOSIS — Z7901 Long term (current) use of anticoagulants: Secondary | ICD-10-CM | POA: Diagnosis not present

## 2020-01-06 DIAGNOSIS — I10 Essential (primary) hypertension: Secondary | ICD-10-CM | POA: Insufficient documentation

## 2020-01-06 DIAGNOSIS — Z79899 Other long term (current) drug therapy: Secondary | ICD-10-CM | POA: Diagnosis not present

## 2020-01-06 DIAGNOSIS — R1900 Intra-abdominal and pelvic swelling, mass and lump, unspecified site: Secondary | ICD-10-CM | POA: Diagnosis present

## 2020-01-06 DIAGNOSIS — D4959 Neoplasm of unspecified behavior of other genitourinary organ: Secondary | ICD-10-CM | POA: Diagnosis not present

## 2020-01-06 DIAGNOSIS — E785 Hyperlipidemia, unspecified: Secondary | ICD-10-CM | POA: Insufficient documentation

## 2020-01-06 DIAGNOSIS — I871 Compression of vein: Secondary | ICD-10-CM | POA: Insufficient documentation

## 2020-01-06 DIAGNOSIS — D259 Leiomyoma of uterus, unspecified: Secondary | ICD-10-CM | POA: Insufficient documentation

## 2020-01-06 DIAGNOSIS — R896 Abnormal cytological findings in specimens from other organs, systems and tissues: Secondary | ICD-10-CM | POA: Diagnosis not present

## 2020-01-06 HISTORY — PX: ROBOTIC ASSISTED TOTAL HYSTERECTOMY WITH BILATERAL SALPINGO OOPHERECTOMY: SHX6086

## 2020-01-06 HISTORY — PX: LAPAROSCOPIC LYSIS OF ADHESIONS: SHX5905

## 2020-01-06 LAB — POCT I-STAT, CHEM 8
BUN: 21 mg/dL (ref 8–23)
Calcium, Ion: 1.23 mmol/L (ref 1.15–1.40)
Chloride: 101 mmol/L (ref 98–111)
Creatinine, Ser: 1.3 mg/dL — ABNORMAL HIGH (ref 0.44–1.00)
Glucose, Bld: 183 mg/dL — ABNORMAL HIGH (ref 70–99)
HCT: 19 % — ABNORMAL LOW (ref 36.0–46.0)
Hemoglobin: 6.5 g/dL — CL (ref 12.0–15.0)
Potassium: 4.5 mmol/L (ref 3.5–5.1)
Sodium: 138 mmol/L (ref 135–145)
TCO2: 31 mmol/L (ref 22–32)

## 2020-01-06 LAB — BASIC METABOLIC PANEL
Anion gap: 15 (ref 5–15)
BUN: 26 mg/dL — ABNORMAL HIGH (ref 8–23)
CO2: 25 mmol/L (ref 22–32)
Calcium: 8.9 mg/dL (ref 8.9–10.3)
Chloride: 100 mmol/L (ref 98–111)
Creatinine, Ser: 1.54 mg/dL — ABNORMAL HIGH (ref 0.44–1.00)
GFR calc Af Amer: 39 mL/min — ABNORMAL LOW (ref >60)
GFR calc non Af Amer: 34 mL/min — ABNORMAL LOW (ref >60)
Glucose, Bld: 167 mg/dL — ABNORMAL HIGH (ref 70–99)
Potassium: 5.2 mmol/L — ABNORMAL HIGH (ref 3.5–5.1)
Sodium: 140 mmol/L (ref 135–145)

## 2020-01-06 LAB — CBC
HCT: 30.9 % — ABNORMAL LOW (ref 36.0–46.0)
Hemoglobin: 10.4 g/dL — ABNORMAL LOW (ref 12.0–15.0)
MCH: 30.4 pg (ref 26.0–34.0)
MCHC: 33.7 g/dL (ref 30.0–36.0)
MCV: 90.4 fL (ref 80.0–100.0)
Platelets: 246 10*3/uL (ref 150–400)
RBC: 3.42 MIL/uL — ABNORMAL LOW (ref 3.87–5.11)
RDW: 14 % (ref 11.5–15.5)
WBC: 20.3 10*3/uL — ABNORMAL HIGH (ref 4.0–10.5)
nRBC: 0 % (ref 0.0–0.2)

## 2020-01-06 LAB — PREPARE RBC (CROSSMATCH)

## 2020-01-06 LAB — ABO/RH: ABO/RH(D): O POS

## 2020-01-06 SURGERY — HYSTERECTOMY, TOTAL, ROBOT-ASSISTED, LAPAROSCOPIC, WITH BILATERAL SALPINGO-OOPHORECTOMY
Anesthesia: General | Laterality: Bilateral

## 2020-01-06 MED ORDER — CEFAZOLIN SODIUM-DEXTROSE 2-4 GM/100ML-% IV SOLN
INTRAVENOUS | Status: AC
  Filled 2020-01-06: qty 100

## 2020-01-06 MED ORDER — ACETAMINOPHEN 500 MG PO TABS
1000.0000 mg | ORAL_TABLET | Freq: Two times a day (BID) | ORAL | Status: DC
  Administered 2020-01-07: 1000 mg via ORAL
  Filled 2020-01-06: qty 2

## 2020-01-06 MED ORDER — SUGAMMADEX SODIUM 500 MG/5ML IV SOLN
INTRAVENOUS | Status: AC
  Filled 2020-01-06: qty 5

## 2020-01-06 MED ORDER — FENTANYL CITRATE (PF) 100 MCG/2ML IJ SOLN
INTRAMUSCULAR | Status: AC
  Filled 2020-01-06: qty 2

## 2020-01-06 MED ORDER — ONDANSETRON HCL 4 MG/2ML IJ SOLN
INTRAMUSCULAR | Status: DC | PRN
  Administered 2020-01-06: 4 mg via INTRAVENOUS

## 2020-01-06 MED ORDER — OXYCODONE HCL 5 MG PO TABS
5.0000 mg | ORAL_TABLET | ORAL | Status: DC | PRN
  Administered 2020-01-06: 5 mg via ORAL
  Filled 2020-01-06: qty 1

## 2020-01-06 MED ORDER — PHENYLEPHRINE 40 MCG/ML (10ML) SYRINGE FOR IV PUSH (FOR BLOOD PRESSURE SUPPORT)
PREFILLED_SYRINGE | INTRAVENOUS | Status: DC | PRN
  Administered 2020-01-06 (×2): 80 ug via INTRAVENOUS

## 2020-01-06 MED ORDER — CEFAZOLIN SODIUM-DEXTROSE 2-3 GM-%(50ML) IV SOLR
INTRAVENOUS | Status: DC | PRN
  Administered 2020-01-06: 2 g via INTRAVENOUS

## 2020-01-06 MED ORDER — STERILE WATER FOR INJECTION IJ SOLN
INTRAMUSCULAR | Status: AC
  Filled 2020-01-06: qty 10

## 2020-01-06 MED ORDER — MIDAZOLAM HCL 2 MG/2ML IJ SOLN
INTRAMUSCULAR | Status: AC
  Filled 2020-01-06: qty 2

## 2020-01-06 MED ORDER — MIDAZOLAM HCL 5 MG/5ML IJ SOLN
INTRAMUSCULAR | Status: DC | PRN
  Administered 2020-01-06: 2 mg via INTRAVENOUS

## 2020-01-06 MED ORDER — ACETAMINOPHEN 10 MG/ML IV SOLN
1000.0000 mg | Freq: Once | INTRAVENOUS | Status: DC | PRN
  Administered 2020-01-06: 1000 mg via INTRAVENOUS

## 2020-01-06 MED ORDER — ONDANSETRON HCL 4 MG/2ML IJ SOLN: 4.0000 mg | Freq: Four times a day (QID) | INTRAMUSCULAR | Status: DC | PRN

## 2020-01-06 MED ORDER — BUPIVACAINE HCL 0.25 % IJ SOLN
INTRAMUSCULAR | Status: AC
  Filled 2020-01-06: qty 1

## 2020-01-06 MED ORDER — SUGAMMADEX SODIUM 500 MG/5ML IV SOLN
INTRAVENOUS | Status: DC | PRN
  Administered 2020-01-06: 300 mg via INTRAVENOUS

## 2020-01-06 MED ORDER — OXYCODONE HCL 5 MG/5ML PO SOLN
5.0000 mg | Freq: Once | ORAL | Status: AC | PRN
  Administered 2020-01-06: 5 mg via ORAL

## 2020-01-06 MED ORDER — ROCURONIUM BROMIDE 10 MG/ML (PF) SYRINGE
PREFILLED_SYRINGE | INTRAVENOUS | Status: DC | PRN
  Administered 2020-01-06: 50 mg via INTRAVENOUS
  Administered 2020-01-06: 20 mg via INTRAVENOUS

## 2020-01-06 MED ORDER — EPHEDRINE 5 MG/ML INJ
INTRAVENOUS | Status: AC
  Filled 2020-01-06: qty 10

## 2020-01-06 MED ORDER — BUPIVACAINE HCL 0.25 % IJ SOLN
INTRAMUSCULAR | Status: DC | PRN
  Administered 2020-01-06: 50 mL

## 2020-01-06 MED ORDER — SENNOSIDES-DOCUSATE SODIUM 8.6-50 MG PO TABS
2.0000 | ORAL_TABLET | Freq: Every day | ORAL | Status: DC
  Filled 2020-01-06: qty 2

## 2020-01-06 MED ORDER — SODIUM CHLORIDE 0.9% IV SOLUTION: Freq: Once | INTRAVENOUS | Status: DC

## 2020-01-06 MED ORDER — ONDANSETRON HCL 4 MG/2ML IJ SOLN
INTRAMUSCULAR | Status: AC
  Filled 2020-01-06: qty 2

## 2020-01-06 MED ORDER — ACETAMINOPHEN 500 MG PO TABS: 1000.0000 mg | ORAL_TABLET | Freq: Once | ORAL | Status: DC | PRN

## 2020-01-06 MED ORDER — ACETAMINOPHEN 10 MG/ML IV SOLN
INTRAVENOUS | Status: AC
  Filled 2020-01-06: qty 100

## 2020-01-06 MED ORDER — KETAMINE HCL 10 MG/ML IJ SOLN
INTRAMUSCULAR | Status: DC | PRN
Start: 2020-01-06 — End: 2020-01-06
  Administered 2020-01-06 (×2): 15 mg via INTRAVENOUS

## 2020-01-06 MED ORDER — LIDOCAINE 2% (20 MG/ML) 5 ML SYRINGE
INTRAMUSCULAR | Status: AC
  Filled 2020-01-06: qty 5

## 2020-01-06 MED ORDER — ONDANSETRON HCL 4 MG/2ML IJ SOLN
4.0000 mg | Freq: Once | INTRAMUSCULAR | Status: AC
  Administered 2020-01-06: 4 mg via INTRAVENOUS

## 2020-01-06 MED ORDER — OXYCODONE HCL 5 MG/5ML PO SOLN
ORAL | Status: AC
  Filled 2020-01-06: qty 5

## 2020-01-06 MED ORDER — PHENYLEPHRINE 40 MCG/ML (10ML) SYRINGE FOR IV PUSH (FOR BLOOD PRESSURE SUPPORT)
PREFILLED_SYRINGE | INTRAVENOUS | Status: AC
  Filled 2020-01-06: qty 10

## 2020-01-06 MED ORDER — PROPOFOL 10 MG/ML IV BOLUS
INTRAVENOUS | Status: AC
  Filled 2020-01-06: qty 20

## 2020-01-06 MED ORDER — DEXAMETHASONE SODIUM PHOSPHATE 4 MG/ML IJ SOLN
4.0000 mg | INTRAMUSCULAR | Status: AC
  Administered 2020-01-06: 8 mg via INTRAVENOUS

## 2020-01-06 MED ORDER — AMISULPRIDE (ANTIEMETIC) 5 MG/2ML IV SOLN
INTRAVENOUS | Status: AC
  Filled 2020-01-06: qty 2

## 2020-01-06 MED ORDER — ONDANSETRON HCL 4 MG PO TABS
4.0000 mg | ORAL_TABLET | Freq: Four times a day (QID) | ORAL | Status: DC | PRN
  Administered 2020-01-07: 4 mg via ORAL
  Filled 2020-01-06: qty 1

## 2020-01-06 MED ORDER — ALBUMIN HUMAN 5 % IV SOLN
INTRAVENOUS | Status: DC | PRN
Start: 2020-01-06 — End: 2020-01-06

## 2020-01-06 MED ORDER — HYDROMORPHONE HCL 1 MG/ML IJ SOLN: 0.5000 mg | INTRAMUSCULAR | Status: DC | PRN

## 2020-01-06 MED ORDER — FENTANYL CITRATE (PF) 100 MCG/2ML IJ SOLN: 25.0000 ug | INTRAMUSCULAR | Status: DC | PRN

## 2020-01-06 MED ORDER — FENTANYL CITRATE (PF) 100 MCG/2ML IJ SOLN
INTRAMUSCULAR | Status: DC | PRN
  Administered 2020-01-06 (×5): 50 ug via INTRAVENOUS

## 2020-01-06 MED ORDER — LACTATED RINGERS IR SOLN
Status: DC | PRN
  Administered 2020-01-06: 1000 mL

## 2020-01-06 MED ORDER — PHENYLEPHRINE 40 MCG/ML (10ML) SYRINGE FOR IV PUSH (FOR BLOOD PRESSURE SUPPORT)
PREFILLED_SYRINGE | INTRAVENOUS | Status: DC | PRN
  Administered 2020-01-06: 40 ug via INTRAVENOUS
  Administered 2020-01-06 (×2): 80 ug via INTRAVENOUS

## 2020-01-06 MED ORDER — ORAL CARE MOUTH RINSE: 15.0000 mL | Freq: Once | OROMUCOSAL | Status: AC

## 2020-01-06 MED ORDER — FENTANYL CITRATE (PF) 250 MCG/5ML IJ SOLN
INTRAMUSCULAR | Status: AC
  Filled 2020-01-06: qty 5

## 2020-01-06 MED ORDER — ENOXAPARIN SODIUM 40 MG/0.4ML ~~LOC~~ SOLN
40.0000 mg | SUBCUTANEOUS | Status: DC
  Administered 2020-01-07: 40 mg via SUBCUTANEOUS
  Filled 2020-01-06: qty 0.4

## 2020-01-06 MED ORDER — AMISULPRIDE (ANTIEMETIC) 5 MG/2ML IV SOLN
5.0000 mg | Freq: Once | INTRAVENOUS | Status: AC
  Administered 2020-01-06: 5 mg via INTRAVENOUS

## 2020-01-06 MED ORDER — LIDOCAINE HCL 2 % IJ SOLN
INTRAMUSCULAR | Status: AC
  Filled 2020-01-06: qty 20

## 2020-01-06 MED ORDER — HEPARIN SODIUM (PORCINE) 5000 UNIT/ML IJ SOLN
5000.0000 [IU] | INTRAMUSCULAR | Status: AC
  Administered 2020-01-06: 5000 [IU] via SUBCUTANEOUS
  Filled 2020-01-06: qty 1

## 2020-01-06 MED ORDER — GABAPENTIN 100 MG PO CAPS
200.0000 mg | ORAL_CAPSULE | ORAL | Status: AC
  Administered 2020-01-06: 200 mg via ORAL
  Filled 2020-01-06: qty 2

## 2020-01-06 MED ORDER — FENTANYL CITRATE (PF) 100 MCG/2ML IJ SOLN
INTRAMUSCULAR | Status: DC | PRN
  Administered 2020-01-06 (×2): 50 ug via INTRAVENOUS
  Administered 2020-01-06: 100 ug via INTRAVENOUS

## 2020-01-06 MED ORDER — ALBUMIN HUMAN 5 % IV SOLN
INTRAVENOUS | Status: AC
  Filled 2020-01-06: qty 250

## 2020-01-06 MED ORDER — ATORVASTATIN CALCIUM 20 MG PO TABS
20.0000 mg | ORAL_TABLET | Freq: Every evening | ORAL | Status: DC
  Administered 2020-01-06: 20 mg via ORAL
  Filled 2020-01-06: qty 1

## 2020-01-06 MED ORDER — LACTATED RINGERS IV SOLN: INTRAVENOUS | Status: DC

## 2020-01-06 MED ORDER — KCL IN DEXTROSE-NACL 20-5-0.45 MEQ/L-%-% IV SOLN
INTRAVENOUS | Status: DC
  Filled 2020-01-06 (×2): qty 1000

## 2020-01-06 MED ORDER — ACETAMINOPHEN 500 MG PO TABS
1000.0000 mg | ORAL_TABLET | ORAL | Status: DC
  Filled 2020-01-06: qty 2

## 2020-01-06 MED ORDER — DEXAMETHASONE SODIUM PHOSPHATE 10 MG/ML IJ SOLN
INTRAMUSCULAR | Status: AC
  Filled 2020-01-06: qty 1

## 2020-01-06 MED ORDER — CHLORHEXIDINE GLUCONATE 0.12 % MT SOLN
15.0000 mL | Freq: Once | OROMUCOSAL | Status: AC
  Administered 2020-01-06: 15 mL via OROMUCOSAL

## 2020-01-06 MED ORDER — ROCURONIUM BROMIDE 10 MG/ML (PF) SYRINGE
PREFILLED_SYRINGE | INTRAVENOUS | Status: AC
  Filled 2020-01-06: qty 10

## 2020-01-06 MED ORDER — IBUPROFEN 400 MG PO TABS
600.0000 mg | ORAL_TABLET | Freq: Four times a day (QID) | ORAL | Status: DC
  Administered 2020-01-07 (×2): 600 mg via ORAL
  Filled 2020-01-06 (×2): qty 1

## 2020-01-06 MED ORDER — ACETAMINOPHEN 160 MG/5ML PO SOLN: 1000.0000 mg | Freq: Once | ORAL | Status: DC | PRN

## 2020-01-06 MED ORDER — PROPOFOL 10 MG/ML IV BOLUS
INTRAVENOUS | Status: DC | PRN
  Administered 2020-01-06: 160 mg via INTRAVENOUS

## 2020-01-06 MED ORDER — LIDOCAINE 2% (20 MG/ML) 5 ML SYRINGE
INTRAMUSCULAR | Status: DC | PRN
  Administered 2020-01-06: 80 mg via INTRAVENOUS
  Administered 2020-01-06: 1.5 mg/kg/h via INTRAVENOUS

## 2020-01-06 MED ORDER — TRAMADOL HCL 50 MG PO TABS
100.0000 mg | ORAL_TABLET | Freq: Four times a day (QID) | ORAL | Status: DC | PRN
  Administered 2020-01-06: 100 mg via ORAL
  Filled 2020-01-06: qty 2

## 2020-01-06 MED ORDER — OXYCODONE HCL 5 MG PO TABS: 5.0000 mg | ORAL_TABLET | Freq: Once | ORAL | Status: AC | PRN

## 2020-01-06 SURGICAL SUPPLY — 79 items
ADH SKN CLS APL DERMABOND .7 (GAUZE/BANDAGES/DRESSINGS) ×2
AGENT HMST KT MTR STRL THRMB (HEMOSTASIS)
APL ESCP 34 STRL LF DISP (HEMOSTASIS) ×2
APL SRG 38 LTWT LNG FL B (MISCELLANEOUS) ×2
APPLICATOR ARISTA FLEXITIP XL (MISCELLANEOUS) ×3 IMPLANT
APPLICATOR SURGIFLO ENDO (HEMOSTASIS) ×3 IMPLANT
BACTOSHIELD CHG 4% 4OZ (MISCELLANEOUS) ×2
BAG LAPAROSCOPIC 12 15 PORT 16 (BASKET) ×1 IMPLANT
BAG RETRIEVAL 12/15 (BASKET) ×3
BAG RETRIEVAL 12/15MM (BASKET) ×1
BAG SPEC RTRVL LRG 6X4 10 (ENDOMECHANICALS)
BLADE SURG SZ10 CARB STEEL (BLADE) ×2 IMPLANT
COVER BACK TABLE 60X90IN (DRAPES) ×4 IMPLANT
COVER TIP SHEARS 8 DVNC (MISCELLANEOUS) ×2 IMPLANT
COVER TIP SHEARS 8MM DA VINCI (MISCELLANEOUS) ×4
COVER WAND RF STERILE (DRAPES) IMPLANT
DECANTER SPIKE VIAL GLASS SM (MISCELLANEOUS) ×2 IMPLANT
DERMABOND ADVANCED (GAUZE/BANDAGES/DRESSINGS) ×2
DERMABOND ADVANCED .7 DNX12 (GAUZE/BANDAGES/DRESSINGS) ×2 IMPLANT
DRAPE ARM DVNC X/XI (DISPOSABLE) ×8 IMPLANT
DRAPE COLUMN DVNC XI (DISPOSABLE) ×2 IMPLANT
DRAPE DA VINCI XI ARM (DISPOSABLE) ×16
DRAPE DA VINCI XI COLUMN (DISPOSABLE) ×4
DRAPE SHEET LG 3/4 BI-LAMINATE (DRAPES) ×4 IMPLANT
DRAPE SURG IRRIG POUCH 19X23 (DRAPES) ×4 IMPLANT
DRSG OPSITE POSTOP 4X6 (GAUZE/BANDAGES/DRESSINGS) IMPLANT
DRSG OPSITE POSTOP 4X8 (GAUZE/BANDAGES/DRESSINGS) IMPLANT
ELECT PENCIL ROCKER SW 15FT (MISCELLANEOUS) ×2 IMPLANT
ELECT REM PT RETURN 15FT ADLT (MISCELLANEOUS) ×4 IMPLANT
GLOVE BIOGEL PI IND STRL 6 (GLOVE) ×3 IMPLANT
GLOVE BIOGEL PI IND STRL 6.5 (GLOVE) IMPLANT
GLOVE BIOGEL PI INDICATOR 6 (GLOVE) ×6
GLOVE BIOGEL PI INDICATOR 6.5 (GLOVE) ×4
GLOVE INDICATOR 6.5 STRL GRN (GLOVE) ×6 IMPLANT
GOWN STRL REUS W/ TWL LRG LVL3 (GOWN DISPOSABLE) ×8 IMPLANT
GOWN STRL REUS W/TWL LRG LVL3 (GOWN DISPOSABLE) ×16
HEMOSTAT ARISTA ABSORB 3G PWDR (HEMOSTASIS) ×3 IMPLANT
HOLDER FOLEY CATH W/STRAP (MISCELLANEOUS) ×4 IMPLANT
IRRIG SUCT STRYKERFLOW 2 WTIP (MISCELLANEOUS) ×4
IRRIGATION SUCT STRKRFLW 2 WTP (MISCELLANEOUS) ×2 IMPLANT
KIT PROCEDURE DA VINCI SI (MISCELLANEOUS)
KIT PROCEDURE DVNC SI (MISCELLANEOUS) IMPLANT
KIT TURNOVER KIT A (KITS) ×3 IMPLANT
MANIPULATOR UTERINE 4.5 ZUMI (MISCELLANEOUS) ×4 IMPLANT
NDL HYPO 21X1.5 SAFETY (NEEDLE) ×2 IMPLANT
NDL SPNL 18GX3.5 QUINCKE PK (NEEDLE) IMPLANT
NEEDLE HYPO 21X1.5 SAFETY (NEEDLE) ×4 IMPLANT
NEEDLE SPNL 18GX3.5 QUINCKE PK (NEEDLE) IMPLANT
OBTURATOR OPTICAL STANDARD 8MM (TROCAR) ×4
OBTURATOR OPTICAL STND 8 DVNC (TROCAR) ×2
OBTURATOR OPTICALSTD 8 DVNC (TROCAR) ×2 IMPLANT
PACK ROBOT GYN CUSTOM WL (TRAY / TRAY PROCEDURE) ×4 IMPLANT
PAD POSITIONING PINK XL (MISCELLANEOUS) ×4 IMPLANT
PORT ACCESS TROCAR AIRSEAL 12 (TROCAR) ×2 IMPLANT
PORT ACCESS TROCAR AIRSEAL 5M (TROCAR) ×2
POUCH SPECIMEN RETRIEVAL 10MM (ENDOMECHANICALS) IMPLANT
SCRUB CHG 4% DYNA-HEX 4OZ (MISCELLANEOUS) ×2 IMPLANT
SEAL CANN UNIV 5-8 DVNC XI (MISCELLANEOUS) ×8 IMPLANT
SEAL XI 5MM-8MM UNIVERSAL (MISCELLANEOUS) ×16
SET IRRIG Y TYPE TUR BLADDER L (SET/KITS/TRAYS/PACK) ×3 IMPLANT
SET TRI-LUMEN FLTR TB AIRSEAL (TUBING) ×4 IMPLANT
SPONGE LAP 18X18 RF (DISPOSABLE) ×3 IMPLANT
SURGIFLO W/THROMBIN 8M KIT (HEMOSTASIS) IMPLANT
SUT MNCRL AB 4-0 PS2 18 (SUTURE) ×4 IMPLANT
SUT PDS AB 1 TP1 96 (SUTURE) ×4 IMPLANT
SUT VIC AB 0 CT1 27 (SUTURE)
SUT VIC AB 0 CT1 27XBRD ANTBC (SUTURE) IMPLANT
SUT VIC AB 2-0 CT1 27 (SUTURE) ×4
SUT VIC AB 2-0 CT1 TAPERPNT 27 (SUTURE) IMPLANT
SUT VICRYL 4-0 PS2 18IN ABS (SUTURE) ×8 IMPLANT
SYR 10ML LL (SYRINGE) IMPLANT
TOWEL OR NON WOVEN STRL DISP B (DISPOSABLE) ×4 IMPLANT
TRAP SPECIMEN MUCUS 40CC (MISCELLANEOUS) ×2 IMPLANT
TRAY FOL W/BAG SLVR 16FR STRL (SET/KITS/TRAYS/PACK) ×1 IMPLANT
TRAY FOLEY W/BAG SLVR 16FR LF (SET/KITS/TRAYS/PACK) ×4
TROCAR XCEL NON-BLD 5MMX100MML (ENDOMECHANICALS) IMPLANT
UNDERPAD 30X36 HEAVY ABSORB (UNDERPADS AND DIAPERS) ×4 IMPLANT
WATER STERILE IRR 1000ML POUR (IV SOLUTION) ×4 IMPLANT
YANKAUER SUCT BULB TIP 10FT TU (MISCELLANEOUS) ×2 IMPLANT

## 2020-01-06 NOTE — Interval H&P Note (Signed)
History and Physical Interval Note:  01/06/2020 10:04 AM  Bethany Simon  has presented today for surgery, with the diagnosis of PELVIC MASS.  The various methods of treatment have been discussed with the patient and family. After consideration of risks, benefits and other options for treatment, the patient has consented to  Procedure(s): XI ROBOTIC ASSISTED TOTAL HYSTERECTOMY WITH BILATERAL SALPINGO OOPHORECTOMY POSSIBLE STAGING, POSSIBLE LAPAROTOMY (Bilateral) as a surgical intervention.  The patient's history has been reviewed, patient examined, no change in status, stable for surgery.  I have reviewed the patient's chart and labs.  Questions were answered to the patient's satisfaction.     [name]

## 2020-01-06 NOTE — Anesthesia Preprocedure Evaluation (Signed)
Anesthesia Evaluation  Patient identified by MRN, date of birth, ID band Patient awake    Reviewed: Allergy & Precautions, NPO status , Patient's Chart, lab work & pertinent test results  History of Anesthesia Complications (+) PONV and history of anesthetic complications  Airway Mallampati: II  TM Distance: >3 FB Neck ROM: Full    Dental  (+) Dental Advisory Given   Pulmonary neg shortness of breath, neg sleep apnea, neg recent URI, former smoker,    breath sounds clear to auscultation       Cardiovascular hypertension, Pt. on medications (-) angina+ Peripheral Vascular Disease  (-) Past MI and (-) CHF  Rhythm:Regular  May-Thurner syndrome   Neuro/Psych negative neurological ROS  negative psych ROS   GI/Hepatic Neg liver ROS, GERD  Controlled,  Endo/Other  negative endocrine ROS  Renal/GU negative Renal ROS     Musculoskeletal  (+) Arthritis ,   Abdominal   Peds  Hematology negative hematology ROS (+)   Anesthesia Other Findings   Reproductive/Obstetrics                             Anesthesia Physical Anesthesia Plan  ASA: II  Anesthesia Plan: General   Post-op Pain Management:    Induction: Intravenous  PONV Risk Score and Plan: 4 or greater and Ondansetron and Dexamethasone  Airway Management Planned: Oral ETT  Additional Equipment: None  Intra-op Plan:   Post-operative Plan: Extubation in OR  Informed Consent: I have reviewed the patients History and Physical, chart, labs and discussed the procedure including the risks, benefits and alternatives for the proposed anesthesia with the patient or authorized representative who has indicated his/her understanding and acceptance.     Dental advisory given  Plan Discussed with: CRNA and Surgeon  Anesthesia Plan Comments: (Amisulpride preop for PONV prophylaxis )        Anesthesia Quick Evaluation

## 2020-01-06 NOTE — Brief Op Note (Signed)
01/06/2020  3:42 PM  PATIENT:  Bethany Simon  69 y.o. female  PRE-OPERATIVE DIAGNOSIS:  PELVIC MASS  POST-OPERATIVE DIAGNOSIS:  Endometriosis, pre-operative intra-abdominal bleeding from endometriosis, pedunculated uterine fibroid/endometrioma  PROCEDURE:  Procedure(s): XI ROBOTIC ASSISTED TOTAL HYSTERECTOMY WITH BILATERAL SALPINGO OOPHORECTOMY  POSLAPAROTOMY (Bilateral)  Robotic TLH, BSO, lysis of adhesions for >45 minutes  SURGEON:  Surgeon(s) and Role:    [name], MD - Primary  ASSISTANTS: Dr. Delsa Sale   ANESTHESIA:   general  EBL:  500 cc (includes blood encountered upon intra-abdominal entry)  BLOOD ADMINISTERED:none  DRAINS: none   LOCAL MEDICATIONS USED:  MARCAINE     SPECIMEN:  Pelvic mass, uterus, cervix, bilateral tubes and ovaries, pelvic washings/blood  DISPOSITION OF SPECIMEN:  PATHOLOGY  COUNTS:  YES  TOURNIQUET:  * No tourniquets in log *  DICTATION: .Note written in EPIC  PLAN OF CARE: Admit for overnight observation  PATIENT DISPOSITION:  PACU - hemodynamically stable.   Delay start of Pharmacological VTE agent (>24hrs) due to surgical blood loss or risk of bleeding: no

## 2020-01-06 NOTE — Anesthesia Procedure Notes (Addendum)

## 2020-01-06 NOTE — Progress Notes (Signed)
Patient c/o eye swelling. Left eye is more swollen than right. Patient states they are having trouble seeing out of left eye. Patient denies trouble breathing or swallowing.  Dr. Delsa Sale notified. Cold compresses recommended. No other orders given.

## 2020-01-06 NOTE — Anesthesia Procedure Notes (Signed)
Date/Time: 01/06/2020 3:53 PM Performed by: Cynda Familia, CRNA Oxygen Delivery Method: Simple face mask Placement Confirmation: positive ETCO2 and breath sounds checked- equal and bilateral Dental Injury: Teeth and Oropharynx as per pre-operative assessment

## 2020-01-06 NOTE — Anesthesia Postprocedure Evaluation (Signed)
Anesthesia Post Note  Patient: Bethany Simon  Procedure(s) Performed: XI ROBOTIC ASSISTED TOTAL HYSTERECTOMY WITH BILATERAL SALPINGO OOPHORECTOMY  POSLAPAROTOMY (Bilateral ) ROBOTIC LAPAROSCOPIC LYSIS OF ADHESIONS     Patient location during evaluation: PACU Anesthesia Type: General Level of consciousness: awake and alert Pain management: pain level controlled Vital Signs Assessment: post-procedure vital signs reviewed and stable Respiratory status: spontaneous breathing, nonlabored ventilation, respiratory function stable and patient connected to nasal cannula oxygen Cardiovascular status: blood pressure returned to baseline and stable Postop Assessment: no apparent nausea or vomiting Anesthetic complications: no   No complications documented.  Last Vitals:  Vitals:   01/06/20 1800 01/06/20 1815  BP: (!) 141/85 (!) 188/79  Pulse: (!) 106 (!) 103  Resp: 17 17  Temp: 36.8 C 37.1 C  SpO2: 100% 100%    Last Pain:  Vitals:   01/06/20 1815  TempSrc: Oral  PainSc: 3                  Jeramy Dimmick

## 2020-01-06 NOTE — Transfer of Care (Signed)
Immediate Anesthesia Transfer of Care Note  Patient: Gicela Schwarting Sanderson  Procedure(s) Performed: XI ROBOTIC ASSISTED TOTAL HYSTERECTOMY WITH BILATERAL SALPINGO OOPHORECTOMY  POSLAPAROTOMY (Bilateral ) ROBOTIC LAPAROSCOPIC LYSIS OF ADHESIONS  Patient Location: PACU  Anesthesia Type:General  Level of Consciousness: sedated  Airway & Oxygen Therapy: Patient Spontanous Breathing and Patient connected to face mask oxygen  Post-op Assessment: Report given to RN and Post -op Vital signs reviewed and stable  Post vital signs: Reviewed and stable  Last Vitals:  Vitals Value Taken Time  BP 160/66 01/06/20 1600  Temp 36.4 C 01/06/20 1600  Pulse 105 01/06/20 1607  Resp 16 01/06/20 1607  SpO2 100 % 01/06/20 1607  Vitals shown include unvalidated device data.  Last Pain:  Vitals:   01/06/20 1600  TempSrc: Oral         Complications: No complications documented.

## 2020-01-06 NOTE — Op Note (Signed)
OPERATIVE NOTE  Pre-operative Diagnosis: pelvic mass  Post-operative Diagnosis: same, evidence of pre-operative intra-abdominal bleeding from pelvic mass presumed to be a pedunculated fibroid involved with endometriosis, significant retroperitoneal fibrosis and inflammation  Operation: Robotic-assisted laparoscopic total hysterectomy with bilateral salpingoophorectomy, pelvic mass excision, lysis of adhesions, backfill of the bladder, mini-laparotomy for mass excision  Surgeon: Jeral Pinch MD  Assistant Surgeon: Lahoma Crocker MD (an MD assistant was necessary for tissue manipulation, management of robotic instrumentation, retraction and positioning due to the complexity of the case and hospital policies).   Anesthesia: GET  Urine Output: 200cc  Operative Findings: On EUA, minimally mobile uterus with normal LUS but inseparable from pelvic mass, spanning 14-16cm superiorly. On intra-abdominal entry, likely 100-200cc of intra-abdominal blood noted as well as clots. Normal liver edge, diaphragm, and stomach. Normal appearing small bowel although multiple loops of small bowel and omentum were adherent either to blood clots or to the large and hemorrhagic/cystic pelvic mass. Normal appearing large bowel. Midline pelvic mass measuring 12-14 cm was noted, centrally located, and indistinguishable from other pelvic organs initially. Ultimately, cystic and solid mass appeared to be a pedunculated fundal fibroid with attachments and blood supply from bilateral pelvic sidewalls, the anterior abdominal wall and the anterior cul-de-sac. More solid areas of the mass with appearance of necrotic fibroids. The mass itself was multi-loculated and rupture of several cystic areas during dissection yielded straw colored fluid. Bilateral adnexa normal in appearance and adherent to the posterior LUS and cul-de-sac. Uterus 8-10cm and otherwise normal in appearance. Adhesions noted between the LUS/cervix and the  bladder. Some obliteration of the posterior cul de sac. No adenopathy. No intra-abdominal or pelvic evidence of disease at the end of surgery.  IOFS of a component of the mass as well as the mass in its entirety after complete excision yielded diagnosis of leiomyoma and endometriosis.  Estimated Blood Loss:  500 cc      Total IV Fluids: see I&O flowsheet         Specimens: uterus, cervix, bilateral tubes and ovaries, pelvic mass         Complications:  None apparent; patient tolerated the procedure well.         Disposition: PACU - hemodynamically stable.  Procedure Details  The patient was seen in the Holding Room. The risks, benefits, complications, treatment options, and expected outcomes were discussed with the patient.  The patient concurred with the proposed plan, giving informed consent.  The site of surgery properly noted/marked. The patient was identified as Bethany Simon and the procedure verified as a Robotic versus open BSO and mass excision, possible total hysterectomy, possible staging.   After induction of anesthesia, the patient was draped and prepped in the usual sterile manner. Patient was placed in supine position after anesthesia and draped and prepped in the usual sterile manner as follows: Her arms were tucked to her side with all appropriate precautions.  The shoulders were stabilized with padded shoulder blocks applied to the acromium processes.  The patient was placed in the semi-lithotomy position in [place].  The perineum and vagina were prepped with CholoraPrep. The patient was draped after the CholoraPrep had been allowed to dry for 3 minutes.  A Time Out was held and the above information confirmed.  The urethra was prepped with Betadine. Foley catheter was placed.  A sterile speculum was placed in the vagina.  The cervix was grasped with a single-tooth tenaculum. The cervix was dilated with Kennon Rounds dilators after an 57  blade scalpel was used to pierce the  stenotic external os.  The ZUMI uterine manipulator with a medium colpotomizer ring was placed without difficulty.  A pneum occluder balloon was placed over the manipulator.  OG tube placement was confirmed and to suction.   Next, a 10 mm skin incision was made 1 cm below the subcostal margin in the midclavicular line.  The 5 mm Optiview port and scope was used for direct entry.  Opening pressure was under 10 mm CO2.  The abdomen was insufflated and the findings were noted as above.   At this point and all points during the procedure, the patient's intra-abdominal pressure did not exceed 15 mmHg. Next, an 8 mm skin incision was made superior to the umbilicus and a right and left port were placed about 8 cm lateral to the robot port on the right and left side.  A fourth arm was placed on the right.  The 5 mm assist trocar was exchanged for a 10-12 mm port. All ports were placed under direct visualization.  The patient was placed in steep Trendelenburg.  Bowel was folded away into the upper abdomen.  The robot was docked in the normal manner.  Given intra-abdominal blood, I suspect that the patient's symptoms of fatigue recently as well as tachycardia on presentation this morning were related to pre-operative anemia.  A combination of blunt, sharp dissection, and monopolar electrocautery were used to lyse adhesions of the omentum and small bowel loops to the pelvic mass. The peritoneal adhesions of the mass to the anterior side wall were also lysed. The right and left peritoneum were opened parallel to the IP ligament to open the retroperitoneal spaces bilaterally. The round ligaments were transected. Bilateral ureters were visualized along the medial leaf of the broad ligament.  The peritoneum above the ureter was incised and stretched and the infundibulopelvic ligament was skeletonized, cauterized and cut.  The mass itself was then elevated and blunt dissection was used to free the mass from bilateral pelvic  sidewalls, freeing it posteriorly. A biopsy of the mass was taken at this point and sent for frozen section.  Attention was then turned anteriorly. Sharp and blunt dissection were used to free the mass of attachments and vascular supply to the bladder peritoneum and anterior cul-de-sac. On free completely, the mass appeared to be attached to the uterine fundus. Upon frozen section results of necrosis and endometriosis, the decision was made to cauterize and transect the stalk attaching the large mass to the uterine fundus. The mass was then placed in an Endocatch bag inserted through the assist trocar.  Now with the uterus much more easily visible, the hysterectomy was continued. The posterior peritoneum was taken down to the level of the KOH ring.  The anterior peritoneum was also taken down.  The bladder flap was created to the level of the KOH ring.  The uterine artery on the right side was skeletonized, cauterized and cut in the normal manner.  A similar procedure was performed on the left.  The colpotomy was made and the uterus, cervix, bilateral ovaries and tubes were amputated and delivered through the vagina.  Pedicles were inspected and excellent hemostasis was achieved.    The colpotomy at the vaginal cuff was closed with Vicryl on a CT1 needle in a running manner.  Irrigation was used and excellent hemostasis was achieved.  Floseal was placed over the uterine pedicles given need to restart anticoagulation as soon as possible post-operatively and Arista was placed  over all raw pelvic surfaces. At this point, the robotic procedure was completed.  Robotic instruments were removed under direct visulaization.  The robot was undocked.   With the abdomen still insufflated, the supraumbilical incision was extended with electrocautery down to the fascia. The fascia was extended to a length of 6-8cm as was the peritoneum. All trocars were then removed. The Endocatch bag was brought through the  mini-laparotomy and ringed forceps were used to deliver the pelvic mass in piecemeal fashion in a contained manner until the bag could be delivered though the incision. The pelvic mass was then handed off the field and sent for frozen section.  The fascia at the mini-laparotomy was closed with 0 looped PDS. The subcutaneous tissue was irrigated and marcaine was injected for local anesthesia. The subcutaneous tissue was reapproximated with 2-0 Vicryl.   The fascia at the 10-12 mm port was closed with 0 Vicryl on a UR-5 needle.  The subcuticular tissue of all incisions were closed with 4-0 Vicryl and the skin was closed with 4-0 Monocryl in a subcuticular manner.  Dermabond was applied.    The vagina was swabbed with  minimal bleeding noted.  Foley catheter was removed.  All sponge, lap and needle counts were correct x  3.   The patient was transferred to the recovery room in stable condition.  Jeral Pinch, MD

## 2020-01-07 ENCOUNTER — Encounter (HOSPITAL_COMMUNITY): Payer: Self-pay | Admitting: Gynecologic Oncology

## 2020-01-07 DIAGNOSIS — N809 Endometriosis, unspecified: Secondary | ICD-10-CM | POA: Diagnosis not present

## 2020-01-07 DIAGNOSIS — I10 Essential (primary) hypertension: Secondary | ICD-10-CM | POA: Diagnosis not present

## 2020-01-07 DIAGNOSIS — Z85828 Personal history of other malignant neoplasm of skin: Secondary | ICD-10-CM | POA: Diagnosis not present

## 2020-01-07 DIAGNOSIS — Z86718 Personal history of other venous thrombosis and embolism: Secondary | ICD-10-CM | POA: Diagnosis not present

## 2020-01-07 DIAGNOSIS — M1990 Unspecified osteoarthritis, unspecified site: Secondary | ICD-10-CM | POA: Diagnosis not present

## 2020-01-07 DIAGNOSIS — E785 Hyperlipidemia, unspecified: Secondary | ICD-10-CM | POA: Diagnosis not present

## 2020-01-07 DIAGNOSIS — R1900 Intra-abdominal and pelvic swelling, mass and lump, unspecified site: Secondary | ICD-10-CM | POA: Diagnosis not present

## 2020-01-07 DIAGNOSIS — D259 Leiomyoma of uterus, unspecified: Secondary | ICD-10-CM | POA: Diagnosis not present

## 2020-01-07 DIAGNOSIS — I871 Compression of vein: Secondary | ICD-10-CM | POA: Diagnosis not present

## 2020-01-07 LAB — TYPE AND SCREEN
ABO/RH(D): O POS
Antibody Screen: NEGATIVE
Unit division: 0
Unit division: 0

## 2020-01-07 LAB — BASIC METABOLIC PANEL
Anion gap: 9 (ref 5–15)
BUN: 24 mg/dL — ABNORMAL HIGH (ref 8–23)
CO2: 24 mmol/L (ref 22–32)
Calcium: 8.5 mg/dL — ABNORMAL LOW (ref 8.9–10.3)
Chloride: 102 mmol/L (ref 98–111)
Creatinine, Ser: 1.08 mg/dL — ABNORMAL HIGH (ref 0.44–1.00)
GFR calc Af Amer: >60 mL/min (ref >60)
GFR calc non Af Amer: 52 mL/min — ABNORMAL LOW (ref >60)
Glucose, Bld: 152 mg/dL — ABNORMAL HIGH (ref 70–99)
Potassium: 5 mmol/L (ref 3.5–5.1)
Sodium: 135 mmol/L (ref 135–145)

## 2020-01-07 LAB — CBC
HCT: 27.1 % — ABNORMAL LOW (ref 36.0–46.0)
HCT: 26.8 % — ABNORMAL LOW (ref 36.0–46.0)
Hemoglobin: 9 g/dL — ABNORMAL LOW (ref 12.0–15.0)
Hemoglobin: 8.8 g/dL — ABNORMAL LOW (ref 12.0–15.0)
MCH: 29.9 pg (ref 26.0–34.0)
MCH: 30.1 pg (ref 26.0–34.0)
MCHC: 33.2 g/dL (ref 30.0–36.0)
MCHC: 32.8 g/dL (ref 30.0–36.0)
MCV: 90 fL (ref 80.0–100.0)
MCV: 91.8 fL (ref 80.0–100.0)
Platelets: 224 10*3/uL (ref 150–400)
Platelets: 226 10*3/uL (ref 150–400)
RBC: 3.01 MIL/uL — ABNORMAL LOW (ref 3.87–5.11)
RBC: 2.92 MIL/uL — ABNORMAL LOW (ref 3.87–5.11)
RDW: 14.6 % (ref 11.5–15.5)
RDW: 14.4 % (ref 11.5–15.5)
WBC: 18 10*3/uL — ABNORMAL HIGH (ref 4.0–10.5)
WBC: 19.1 10*3/uL — ABNORMAL HIGH (ref 4.0–10.5)
nRBC: 0 % (ref 0.0–0.2)
nRBC: 0 % (ref 0.0–0.2)

## 2020-01-07 LAB — BPAM RBC
Blood Product Expiration Date: 202108072359
Blood Product Expiration Date: 202108082359
ISSUE DATE / TIME: 202107151557
ISSUE DATE / TIME: 202107151557
Unit Type and Rh: 5100
Unit Type and Rh: 5100

## 2020-01-07 MED ORDER — CALCIUM CARBONATE ANTACID 500 MG PO CHEW
1.0000 | CHEWABLE_TABLET | Freq: Three times a day (TID) | ORAL | Status: DC | PRN
  Administered 2020-01-07: 400 mg via ORAL
  Filled 2020-01-07: qty 2

## 2020-01-07 NOTE — Discharge Instructions (Signed)
01/07/2020  YOU WILL NEED TO GIVE YOURSELF ONE LOVENOX INJECTION AT 10 AM Saturday (January 08, 2020) MORNING. YOU CAN USE THE LOVENOX YOU HAVE AT HOME BUT THE DOSE NEEDS TO BE 40 MG. YOU WILL NEED TO PUSH THE MEDICATION IN THE SYRINGE YOU HAVE AT HOME UP TO 0.4 ML (40 MG) THEN PULL BACK TO ADD A SMALL AIR BUBBLE. THE AIR BUBBLE NEEDS TO BE THE LAST THING THAT GOES IN AFTER THE MEDICATION.  RESUME YOUR ELIQUIS FULL DOSING THE MORNING OF July 18, Sunday, IN THE AM. DO NOT TAKE LOVENOX AFTER YOUR ONE DOSE ON Saturday.  Return to work: 4-6 weeks if applicable  Activity: 1. Be up and out of the bed during the day.  Take a nap if needed.  You may walk up steps but be careful and use the hand rail.  Stair climbing will tire you more than you think, you may need to stop part way and rest.   2. No lifting or straining for 6 weeks.  3. No driving for 1 week(s).  Do not drive if you are taking narcotic pain medicine.  4. Shower daily.  Use soap and water on your incision and pat dry; don't rub.  No tub baths until cleared by your surgeon.   5. No sexual activity and nothing in the vagina for 8 weeks.  6. You may experience a small amount of clear drainage from your incisions, which is normal.  If the drainage persists or increases, please call the office.  7. You may experience vaginal spotting after surgery or around the 6-8 week mark from surgery when the stitches at the top of the vagina begin to dissolve.  The spotting is normal but if you experience heavy bleeding, call our office.  8. Take Tylenol or ibuprofen first for pain and only use narcotic pain medication for severe pain not relieved by the Tylenol or Ibuprofen.  Monitor your Tylenol intake to a max of 4,000 mg.  Diet: 1. Low sodium Heart Healthy Diet is recommended.  2. It is safe to use a laxative, such as Miralax or Colace, if you have difficulty moving your bowels. You can take Sennakot at bedtime every evening to keep bowel movements  regular and to prevent constipation.    Wound Care: 1. Keep clean and dry.  Shower daily.  Reasons to call the Doctor:  Fever - Oral temperature greater than 100.4 degrees Fahrenheit  Foul-smelling vaginal discharge  Difficulty urinating  Nausea and vomiting  Increased pain at the site of the incision that is unrelieved with pain medicine.  Difficulty breathing with or without chest pain  New calf pain especially if only on one side  Sudden, continuing increased vaginal bleeding with or without clots.   Contacts: For questions or concerns you should contact:  Dr. Jeral Pinch at [phone]  Joylene John, NP at [phone]  After Hours: call [phone] and have the GYN Oncologist paged/contacted

## 2020-01-07 NOTE — Progress Notes (Signed)
1 Day Post-Op Procedure(s) (LRB): XI ROBOTIC ASSISTED TOTAL HYSTERECTOMY WITH BILATERAL SALPINGO OOPHORECTOMY  POSLAPAROTOMY (Bilateral) ROBOTIC LAPAROSCOPIC LYSIS OF ADHESIONS  Subjective: Patient reports decrease in facial edema post-operatively.  She is using cool compresses.  Ate breakfast this am with mild nausea after that resolved. No flatus. Urinating without difficulty. No vaginal bleeding noted except for the first time urinating with minimal amount of blood noted that was from surgery. No concerns voiced.    Objective: Vital signs in last 24 hours: Temp:  [97.5 F (36.4 C)-99.1 F (37.3 C)] 98.6 F (37 C) (07/16 0520) Pulse Rate:  [91-111] 94 (07/16 0520) Resp:  [14-19] 14 (07/16 0520) BP: (130-188)/(61-85) 156/74 (07/16 0520) SpO2:  [95 %-100 %] 95 % (07/16 0520) Weight:  [202 lb 3.3 oz (91.7 kg)] 202 lb 3.3 oz (91.7 kg) (07/15 1815) Last BM Date: 01/06/20  Intake/Output from previous day: 07/15 0701 - 07/16 0700 In: 4442.1 [P.O.:480; I.V.:3028.1; Blood:634; IV Piggyback:300] Out: 1235 [Urine:925; Blood:310]  Physical Examination: General: alert, cooperative and no distress Resp: clear to auscultation bilaterally Cardio: regular rate and rhythm, S1, S2 normal, no murmur, click, rub or gallop GI: incision: lap sites to the abdomen with dermabond intact with no active drainage. Mini-lap incision with dermabond intact and abdomen soft, hypoactive bowel sounds Extremities: extremities normal, atraumatic, no cyanosis or edema  Facial and orbital edema noted-improving per pt  Labs: WBC/Hgb/Hct/Plts:  18.0/9.0/27.1/224 (07/16 0441) BUN/Cr/glu/ALT/AST/amyl/lip:  24/1.08/--/--/--/--/-- (07/16 0441)  Assessment: 69 y.o. s/p Procedure(s): XI ROBOTIC ASSISTED TOTAL HYSTERECTOMY WITH BILATERAL SALPINGO OOPHORECTOMY  POSLAPAROTOMY ROBOTIC LAPAROSCOPIC LYSIS OF ADHESIONS: stable Pain:  Pain is well-controlled on PRN medications.  Heme: Hgb 9.0 and Hct 27.1 this am. S/P 2 units  PRBCs. Will recheck CBC around noon to assess for stability. Anemia related to pre-op intraabdominal bleeding and surgical blood loss.  CV: BP and HR stable-BP slightly elevated.  Can resume her BP med tomorrow.  GI:  Tolerating po: Yes. Antiemetics ordered if needed.  GU: Creatinine 1.08 this am. Improving from 1.54 last evening.    FEN: No critical values this am.  Prophylaxis: SCDs. Prophylactic dose of lovenox to be given this am.  Plan: Saline lock IV CBC around noon Increase ambulation Plan for probable discharge later today if labwork stable, pt meeting post-op criteria Continue plan per Dr. Berline Lopes   LOS: 0 days    Bethany Simon 01/07/2020, 9:49 AM

## 2020-01-07 NOTE — Discharge Summary (Signed)
Physician Discharge Summary  Patient ID: Bethany Simon MRN: [id number] DOB/AGE: [id number] 69 y.o.  Admit date: 01/06/2020 Discharge date: 01/07/2020  Admission Diagnoses: Pelvic mass  Discharge Diagnoses:  Principal Problem:   Pelvic mass Active Problems:   Endometriosis   Discharged Condition:  The patient is in good condition and stable for discharge.   Hospital Course: On 01/06/2020, the patient underwent the following: Procedure(s): XI ROBOTIC ASSISTED TOTAL HYSTERECTOMY WITH BILATERAL SALPINGO OOPHORECTOMY POSS LAPAROTOMY ROBOTIC LAPAROSCOPIC LYSIS OF ADHESIONS.  The postoperative course was uneventful.  Blood counts repeated multiple times post-operatively s/p transfusion and stable. She was discharged to home on postoperative day 1 tolerating a regular diet, ambulating, voiding, pain controlled.  Consults: None  Significant Diagnostic Studies: Labs  Treatments: surgery: see above  Discharge Exam (AM assessment): Blood pressure (!) 169/64, pulse 98, temperature 98.2 F (36.8 C), temperature source Oral, resp. rate 18, height 5\' 7"  (1.702 m), weight 202 lb 3.3 oz (91.7 kg), SpO2 98 %. General appearance: alert, cooperative and no distress Resp: clear to auscultation bilaterally Cardio: regular rate and rhythm, S1, S2 normal, no murmur, click, rub or gallop GI: soft, non-tender; bowel sounds normal; no masses,  no organomegaly Extremities: extremities normal, atraumatic, no cyanosis or edema Incision/Wound: Lap sites to the abdomen with dermabond without active drainage, mini lap in upper abdomen intact  Disposition: Discharge disposition: 01-Home or Self Care       Discharge Instructions    Call MD for:  difficulty breathing, headache or visual disturbances   Complete by: As directed    Call MD for:  extreme fatigue   Complete by: As directed    Call MD for:  hives   Complete by: As directed    Call MD for:  persistant dizziness or light-headedness   Complete  by: As directed    Call MD for:  persistant nausea and vomiting   Complete by: As directed    Call MD for:  redness, tenderness, or signs of infection (pain, swelling, redness, odor or green/yellow discharge around incision site)   Complete by: As directed    Call MD for:  severe uncontrolled pain   Complete by: As directed    Call MD for:  temperature >100.4   Complete by: As directed    Diet - low sodium heart healthy   Complete by: As directed    Driving Restrictions   Complete by: As directed    No driving for 1 week.  Do not take narcotics and drive.   Increase activity slowly   Complete by: As directed    Lifting restrictions   Complete by: As directed    No lifting greater than 10 lbs for 6 weeks   Sexual Activity Restrictions   Complete by: As directed    No sexual activity, nothing in the vagina, for 8 weeks.     Allergies as of 01/07/2020      Reactions   Atropine    convulsion      Medication List    STOP taking these medications   apixaban 5 MG Tabs tablet Commonly known as: Eliquis   enoxaparin 100 MG/ML injection Commonly known as: Lovenox     TAKE these medications   acetaminophen 500 MG tablet Commonly known as: TYLENOL Take 1,000 mg by mouth every 8 (eight) hours as needed for moderate pain.   Align 4 MG Caps Take 4 mg by mouth daily.   atorvastatin 20 MG tablet Commonly known as: LIPITOR Take 20  mg by mouth every evening.   lisinopril 10 MG tablet Commonly known as: ZESTRIL Take 10 mg by mouth daily.   ondansetron 8 MG disintegrating tablet Commonly known as: ZOFRAN-ODT Take 8 mg by mouth every 8 (eight) hours as needed for nausea or vomiting.   oxyCODONE 5 MG immediate release tablet Commonly known as: Oxy IR/ROXICODONE Take 1 tablet (5 mg total) by mouth every 4 (four) hours as needed for severe pain. For AFTER surgery, do not take and drive   Restasis 0.25 % ophthalmic emulsion Generic drug: cycloSPORINE Place 1 drop into both  eyes 2 (two) times daily.   senna-docusate 8.6-50 MG tablet Commonly known as: Senokot-S Take 2 tablets by mouth at bedtime. For AFTER surgery, do not take if having diarrhea       Follow-up Information    [name], MD Follow up on 01/12/2020.   Specialty: Gynecologic Oncology Why: at 3:30pm will be a PHONE visit to discuss final pathology and see how you are doing.  IN PERSON visit will on January 27, 2020 at 3:45pm at the [facility]. Contact information: [address] [place] [address] [phone]               Greater than thirty minutes were spend for face to face discharge instructions and discharge orders/summary in EPIC.   Signed: Dorothyann Gibbs 01/07/2020, 3:27 PM

## 2020-01-08 ENCOUNTER — Other Ambulatory Visit: Payer: Medicare PPO

## 2020-01-10 ENCOUNTER — Telehealth: Payer: Self-pay

## 2020-01-10 NOTE — Telephone Encounter (Signed)
Bethany Simon  stated that is is doing well. Abdomen is sore when is gets up and moves around. Current pain medications effective for pain control. She is eating, drinking, and urinating well. Facial swelling continues to improve. She is passing a lot of gas. No BM. Told Bethany Simon to increase the senokot-s to 2 tabs bid. Take each dose with 8 oz of water.  If she does not have a BM by mid day tomorrow, she needs to add a capful of Miralax bid. Pt verbalized understanding. Incisions are D&I. Afebrile. Resumed Eliquis today at full dose. Did Lovenox 40 mg injection daily 7-17 and 01-09-20. She is aware of her post op appointments and the office number [phone] to call if she has any questions or concerns.

## 2020-01-11 ENCOUNTER — Telehealth: Payer: Self-pay | Admitting: Gynecologic Oncology

## 2020-01-12 ENCOUNTER — Inpatient Hospital Stay (HOSPITAL_BASED_OUTPATIENT_CLINIC_OR_DEPARTMENT_OTHER): Payer: Medicare PPO | Admitting: Gynecologic Oncology

## 2020-01-12 DIAGNOSIS — D3910 Neoplasm of uncertain behavior of unspecified ovary: Secondary | ICD-10-CM

## 2020-01-12 LAB — SURGICAL PATHOLOGY

## 2020-01-12 LAB — CYTOLOGY - NON PAP

## 2020-01-12 NOTE — Progress Notes (Signed)
Gynecologic Oncology Telehealth Consult Note: Gyn-Onc  I connected with Bethany Simon on 01/12/20 at  3:30 PM EDT by telephone and verified that I am speaking with the correct person using two identifiers.  I discussed the limitations, risks, security and privacy concerns of performing an evaluation and management service by telemedicine and the availability of in-person appointments. I also discussed with the patient that there may be a patient responsible charge related to this service. The patient expressed understanding and agreed to proceed.  Other persons participating in the visit and their role in the encounter: none.  Patient's location: home Provider's location: [facility]  Reason for Visit: f/u after recent surgery, pathology review, treatment planning  Interval History: Patient reports overall doing well. Denies any nausea or emesis; feels much better since surgery and appetite still decreased but improved. Reports regular bowel function, denies any urinary symptoms. Has had some small vaginal spotting, denies any discharge. Denies fevers or chills.  Past Medical/Surgical History: Past Medical History:  Diagnosis Date  . Arthritis   . Complication of anesthesia   . DVT (deep venous thrombosis) ([place])   . GERD (gastroesophageal reflux disease)   . Hyperlipidemia   . Hypertension   . May-Thurner syndrome   . Osteopenia   . Pelvic mass   . PONV (postoperative nausea and vomiting)   . SCC (squamous cell carcinoma)    skin    Past Surgical History:  Procedure Laterality Date  . BREAST ENHANCEMENT SURGERY    . BREAST SURGERY    . CATARACT EXTRACTION Bilateral 2016  . IVC FILTER INSERTION N/A 01/03/2020   Procedure: IVC FILTER INSERTION;  Surgeon: [name], MD;  Location: [facility];  Service: Cardiovascular;  Laterality: N/A;  . IVC VENOGRAPHY N/A 01/03/2020   Procedure: IVC Venography;  Surgeon: [name], MD;  Location: [facility];  Service: Cardiovascular;  Laterality: N/A;  . LAPAROSCOPIC LYSIS OF ADHESIONS  01/06/2020   Procedure: ROBOTIC LAPAROSCOPIC LYSIS OF ADHESIONS;  Surgeon: [name], MD;  Location: [facility];  Service: Gynecology;;  . LEEP  2000  . ROBOTIC ASSISTED TOTAL HYSTERECTOMY WITH BILATERAL SALPINGO OOPHERECTOMY Bilateral 01/06/2020   Procedure: XI ROBOTIC ASSISTED TOTAL HYSTERECTOMY WITH BILATERAL SALPINGO OOPHORECTOMY  POSLAPAROTOMY;  Surgeon: [name], MD;  Location: [facility];  Service: Gynecology;  Laterality: Bilateral;    Family History  Problem Relation Age of Onset  . Cancer Mother        breast, dx in last 50s  . Hypertension Mother   . Diabetes Mother        pre diabetes  . Hypertension Father   . Cancer Brother        brain  . Diabetes Brother   . Cancer Maternal Aunt        possible breast  . Cancer Daughter        thyroid  . Cancer Paternal Uncle        colon  . Breast cancer Maternal Grandmother        dx in 66s    Social History   Socioeconomic History  . Marital status: Married    Spouse name: Not on file  . Number of children: Not on file  . Years of education: Not on file  . Highest education level: Not on file  Occupational History  . Occupation: retired  Tobacco Use  . Smoking status: Former Smoker    Quit date: 1979    Years since  quitting: 42.5  . Smokeless tobacco: Never Used  Vaping Use  . Vaping Use: Never used  Substance and Sexual Activity  . Alcohol use: Yes    Comment: social  . Drug use: Never  . Sexual activity: Not Currently  Other Topics Concern  . Not on file  Social History Narrative  . Not on file   Social Determinants of Health   Financial Resource Strain:   . Difficulty of Paying Living Expenses:   Food Insecurity:   . Worried About Charity fundraiser in the Last Year:   . Arboriculturist in the Last Year:   Transportation Needs:   . Film/video editor (Medical):   Marland Kitchen Lack of Transportation  (Non-Medical):   Physical Activity:   . Days of Exercise per Week:   . Minutes of Exercise per Session:   Stress:   . Feeling of Stress :   Social Connections:   . Frequency of Communication with Friends and Family:   . Frequency of Social Gatherings with Friends and Family:   . Attends Religious Services:   . Active Member of Clubs or Organizations:   . Attends Archivist Meetings:   Marland Kitchen Marital Status:     Current Medications:  Current Outpatient Medications:  .  acetaminophen (TYLENOL) 500 MG tablet, Take 1,000 mg by mouth every 8 (eight) hours as needed for moderate pain., Disp: , Rfl:  .  atorvastatin (LIPITOR) 20 MG tablet, Take 20 mg by mouth every evening. , Disp: , Rfl:  .  lisinopril (ZESTRIL) 10 MG tablet, Take 10 mg by mouth daily., Disp: , Rfl:  .  ondansetron (ZOFRAN-ODT) 8 MG disintegrating tablet, Take 8 mg by mouth every 8 (eight) hours as needed for nausea or vomiting. , Disp: , Rfl:  .  oxyCODONE (OXY IR/ROXICODONE) 5 MG immediate release tablet, Take 1 tablet (5 mg total) by mouth every 4 (four) hours as needed for severe pain. For AFTER surgery, do not take and drive, Disp: 10 tablet, Rfl: 0 .  Probiotic Product (ALIGN) 4 MG CAPS, Take 4 mg by mouth daily., Disp: , Rfl:  .  RESTASIS 0.05 % ophthalmic emulsion, Place 1 drop into both eyes 2 (two) times daily., Disp: , Rfl:  .  senna-docusate (SENOKOT-S) 8.6-50 MG tablet, Take 2 tablets by mouth at bedtime. For AFTER surgery, do not take if having diarrhea, Disp: 30 tablet, Rfl: 0  Review of Symptoms: Pertinent positives as per HPI.  Physical Exam: There were no vitals taken for this visit. Not performed given limitations of phone visit  Laboratory & Radiologic Studies: FINAL MICROSCOPIC DIAGNOSIS:   A. PELVIC MASS, LEFT, BIOPSY:  - Endometriosis.   B. PELVIC MASS, RESECTION:  - Endometrial glands and smooth muscle.  - See comment.   C. UTERUS, CERVIX, BILATERAL FALLOPIAN TUBES AND OVARIES,  HYSTERECTOMY  AND BILATERAL SALPINGO-OOPHORECTOMY:  - Seromucinous borderline tumor of each the right and left ovaries, with  ovarian surface involvement of the left ovary.  - No involvement of uterine corpus, uterine cervix and fallopian tubes.  - See oncology table.   A. PELVIC WASHING:    FINAL MICROSCOPIC DIAGNOSIS:  - Atypical cells present   Assessment & Plan: Bethany Simon is a 69 y.o. 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  Overall doing very well from a post-operative standpoint. We discussed final pathology and significance of borderline tumors. While her risk is greatly decreased  given surgery, she still has a recurrence risk (risk of malignancy is likely <5%) and will need to undergo surveillance. We discussed both NCCN and SGO surveillance recommendations. Given that she underwent total hysterectomy and BSO, pelvic ultrasounds not indicated. We will plan on q 6-12 months surveillance visits with CA-125. Difficult to know if her CA-125 was elevated in the setting of her borderline tumors and/or endometriosis. All questions answered.  I discussed the assessment and treatment plan with the patient. The patient was provided with an opportunity to ask questions and all were answered. The patient agreed with the plan and demonstrated an understanding of the instructions.   The patient was advised to call back or see an in-person evaluation if the symptoms worsen or if the condition fails to improve as anticipated.   16 minutes of total time was spent for this patient encounter, including preparation, face-to-face counseling with the patient and coordination of care, and documentation of the encounter.   Jeral Pinch, MD  Division of Gynecologic Oncology  Department of Obstetrics and Gynecology  [facility]

## 2020-01-13 ENCOUNTER — Encounter: Payer: Self-pay | Admitting: Gynecologic Oncology

## 2020-01-20 ENCOUNTER — Other Ambulatory Visit: Payer: Self-pay

## 2020-01-27 ENCOUNTER — Encounter: Payer: Self-pay | Admitting: Gynecologic Oncology

## 2020-01-27 ENCOUNTER — Inpatient Hospital Stay: Payer: Medicare PPO | Attending: Gynecologic Oncology | Admitting: Gynecologic Oncology

## 2020-01-27 ENCOUNTER — Other Ambulatory Visit: Payer: Self-pay

## 2020-01-27 VITALS — BP 104/58 | HR 91 | Temp 97.9°F | Resp 16 | Ht 67.0 in | Wt 194.2 lb

## 2020-01-27 DIAGNOSIS — Z87891 Personal history of nicotine dependence: Secondary | ICD-10-CM | POA: Insufficient documentation

## 2020-01-27 DIAGNOSIS — R5383 Other fatigue: Secondary | ICD-10-CM | POA: Insufficient documentation

## 2020-01-27 DIAGNOSIS — M1990 Unspecified osteoarthritis, unspecified site: Secondary | ICD-10-CM | POA: Insufficient documentation

## 2020-01-27 DIAGNOSIS — D3910 Neoplasm of uncertain behavior of unspecified ovary: Secondary | ICD-10-CM | POA: Insufficient documentation

## 2020-01-27 DIAGNOSIS — K5900 Constipation, unspecified: Secondary | ICD-10-CM | POA: Insufficient documentation

## 2020-01-27 DIAGNOSIS — R141 Gas pain: Secondary | ICD-10-CM

## 2020-01-27 DIAGNOSIS — N809 Endometriosis, unspecified: Secondary | ICD-10-CM | POA: Insufficient documentation

## 2020-01-27 DIAGNOSIS — Z79899 Other long term (current) drug therapy: Secondary | ICD-10-CM | POA: Insufficient documentation

## 2020-01-27 DIAGNOSIS — Z90722 Acquired absence of ovaries, bilateral: Secondary | ICD-10-CM | POA: Insufficient documentation

## 2020-01-27 DIAGNOSIS — Z90710 Acquired absence of both cervix and uterus: Secondary | ICD-10-CM

## 2020-01-27 NOTE — Patient Instructions (Signed)
You are healing well from surgery!  Remember no heavy lifting until you are 6 weeks out from surgery and nothing in the vagina until 8 weeks.  Please let me know how your bowels are functioning after another couple weeks of working on diet and water intake.  We will plan on 60-month surveillance visits with an exam and a CA-125.  Please call towards the end of this year to get a visit scheduled with me for early February.  If you develop any new symptoms, such as abdominal pain, changes to your bowel function, or other concerning symptoms, please call to be seen sooner than that at [phone].

## 2020-01-27 NOTE — Progress Notes (Signed)
Gynecologic Oncology Return Clinic Visit  01/27/20   Reason for Visit: Postoperative visit and discussion of treatment plan  Treatment History: CT scan on 6/19: Heterogeneous mass within the pelvis with areas of cystic or necrotic changes concerning for malignancy. 7/12: Patient underwent IVC filter placement given recent history of DVT in anticipation of surgery. 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  Interval History: Patient presents today overall doing well.  She notes ongoing gastrointestinal issues which she describes as intermittent constipation as well as gas pain.  She was taking the Senokot for about a week and a half after surgery with good return of bowel function.  She then stopped the medication and got very constipated earlier this week.  She had diarrhea on Tuesday, and then decided to restart the Senokot.  She also describes having gas pain despite occasionally being able to expel gas.  Her appetite has been intermittently decreased in relation to her constipation and gas pain.  Otherwise she reports a good appetite without nausea or emesis.  She denies any other abdominal or pelvic pain.  She denies vaginal discharge, bleeding, fevers, or chills.  She endorses continued fatigue.  Past Medical/Surgical History: Past Medical History:  Diagnosis Date  . Arthritis   . Complication of anesthesia   . DVT (deep venous thrombosis) ([place])   . GERD (gastroesophageal reflux disease)   . Hyperlipidemia   . Hypertension   . May-Thurner syndrome   . Osteopenia   . Pelvic mass   . PONV (postoperative nausea and vomiting)   . SCC (squamous cell carcinoma)    skin    Past Surgical History:  Procedure Laterality Date  . BREAST ENHANCEMENT SURGERY    . BREAST SURGERY    . CATARACT EXTRACTION  Bilateral 2016  . IVC FILTER INSERTION N/A 01/03/2020   Procedure: IVC FILTER INSERTION;  Surgeon: [name], MD;  Location: [facility];  Service: Cardiovascular;  Laterality: N/A;  . IVC VENOGRAPHY N/A 01/03/2020   Procedure: IVC Venography;  Surgeon: [name], MD;  Location: [facility];  Service: Cardiovascular;  Laterality: N/A;  . LAPAROSCOPIC LYSIS OF ADHESIONS  01/06/2020   Procedure: ROBOTIC LAPAROSCOPIC LYSIS OF ADHESIONS;  Surgeon: [name], MD;  [facility];  Service: Gynecology;;  . LEEP  2000  . ROBOTIC ASSISTED TOTAL HYSTERECTOMY WITH BILATERAL SALPINGO OOPHERECTOMY Bilateral 01/06/2020   Procedure: XI ROBOTIC ASSISTED TOTAL HYSTERECTOMY WITH BILATERAL SALPINGO OOPHORECTOMY  POSLAPAROTOMY;  Surgeon: [name], MD;  [facility]: [facility];  Service: Gynecology;  Laterality: Bilateral;    Family History  Problem Relation Age of Onset  . Cancer Mother        breast, dx in last 72s  . Hypertension Mother   . Diabetes Mother        pre diabetes  . Hypertension Father   . Cancer Brother        brain  . Diabetes Brother   . Cancer Maternal Aunt        possible breast  . Cancer Daughter        thyroid  . Cancer Paternal Uncle        colon  . Breast cancer Maternal Grandmother        dx in 51s    Social History   Socioeconomic History  .  Marital status: Married    Spouse name: Not on file  . Number of children: Not on file  . Years of education: Not on file  . Highest education level: Not on file  Occupational History  . Occupation: retired  Tobacco Use  . Smoking status: Former Smoker    Quit date: 1979    Years since quitting: 42.6  . Smokeless tobacco: Never Used  Vaping Use  . Vaping Use: Never used  Substance and Sexual Activity  . Alcohol use: Yes    Comment: social  . Drug use: Never  . Sexual activity: Not Currently  Other Topics Concern  . Not on file  Social History Narrative  .  Not on file   Social Determinants of Health   Financial Resource Strain:   . Difficulty of Paying Living Expenses:   Food Insecurity:   . Worried About Charity fundraiser in the Last Year:   . Arboriculturist in the Last Year:   Transportation Needs:   . Film/video editor (Medical):   Marland Kitchen Lack of Transportation (Non-Medical):   Physical Activity:   . Days of Exercise per Week:   . Minutes of Exercise per Session:   Stress:   . Feeling of Stress :   Social Connections:   . Frequency of Communication with Friends and Family:   . Frequency of Social Gatherings with Friends and Family:   . Attends Religious Services:   . Active Member of Clubs or Organizations:   . Attends Archivist Meetings:   Marland Kitchen Marital Status:     Current Medications:  Current Outpatient Medications:  .  acetaminophen (TYLENOL) 500 MG tablet, Take 1,000 mg by mouth every 8 (eight) hours as needed for moderate pain., Disp: , Rfl:  .  atorvastatin (LIPITOR) 20 MG tablet, Take 20 mg by mouth every evening. , Disp: , Rfl:  .  ELIQUIS 5 MG TABS tablet, 2 (two) times daily. , Disp: , Rfl:  .  lisinopril (ZESTRIL) 10 MG tablet, Take 10 mg by mouth daily., Disp: , Rfl:  .  ondansetron (ZOFRAN-ODT) 8 MG disintegrating tablet, Take 8 mg by mouth every 8 (eight) hours as needed for nausea or vomiting. , Disp: , Rfl:  .  Probiotic Product (ALIGN) 4 MG CAPS, Take 4 mg by mouth daily., Disp: , Rfl:  .  RESTASIS 0.05 % ophthalmic emulsion, Place 1 drop into both eyes 2 (two) times daily., Disp: , Rfl:  .  senna-docusate (SENOKOT-S) 8.6-50 MG tablet, Take 2 tablets by mouth at bedtime. For AFTER surgery, do not take if having diarrhea, Disp: 30 tablet, Rfl: 0  Review of Systems: Pertinent positives as per HPI. Denies fevers, chills, unexplained weight changes. Denies hearing loss, neck lumps or masses, mouth sores, ringing in ears or voice changes. Denies cough or wheezing.  Denies shortness of breath. Denies  chest pain or palpitations. Denies leg swelling. Denies abdominal distention, blood in stools, nausea, vomiting, or early satiety. Denies pain with intercourse, dysuria, frequency, hematuria or incontinence. Denies hot flashes, pelvic pain, vaginal bleeding or vaginal discharge.   Denies joint pain, back pain or muscle pain/cramps. Denies itching, rash, or wounds. Denies dizziness, headaches, numbness or seizures. Denies swollen lymph nodes or glands, denies easy bruising or bleeding. Denies anxiety, depression, confusion, or decreased concentration.  Physical Exam: BP (!) 104/58 (BP Location: Left Arm, Patient Position: Sitting)   Pulse 91   Temp 97.9 F (36.6 C) (Oral)   Resp  16   Ht 5\' 7"  (1.702 m)   Wt 194 lb 4 oz (88.1 kg)   SpO2 98%   BMI 30.42 kg/m  General: Alert, oriented, no acute distress. HEENT: Normocephalic, atraumatic, sclera anicteric. Chest: Unlabored breathing on room air. Abdomen: soft, nontender.  Normoactive bowel sounds.  No masses or hepatosplenomegaly appreciated.  Well-healing laparoscopic incisions. Extremities: Grossly normal range of motion.  Warm, well perfused.  No edema bilaterally. Skin: No rashes or lesions noted. GU: Normal appearing external genitalia without erythema, excoriation, or lesions.  Speculum exam reveals mildly atrophic vaginal mucosa, cuff intact with sutures visible.  No discharge or bleeding.  Bimanual exam reveals cuff intact, no fluctuance or tenderness.    Laboratory & Radiologic Studies: None new  Assessment & Plan: Bethany Simon is a 69 y.o. 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  Patient is overall doing well from a postoperative standpoint.  She continues to have some GI symptoms, which were present when she presented.  We discussed that these are less likely to be related to surgery given their presence prior to surgery.  It is possible that they are related to the  endometriosis found at the time of surgery although this also seems unlikely given the fact that endometriosis should be dormant in the setting of estrogen withdrawal.  She was having good bowel function with Senokot.  I have encouraged her to continue using this medication as well as work on increasing her water intake and adding some fiber to her diet.  She will let me know in the next 3 to 4 weeks how her bowel function is, and I suggested that if she is still having issues with constipation and gas pain that she reach back out to the gastroenterologist she saw.  We reviewed her pathology again in detail, and the patient was given a copy of her pathology report. CA-125 was initially elevated prior to surgery at 124.  We discussed [facility] surveillance recommendations that include of surveillance visit and exam every 3-6 months for 5 years.  In the setting of completion surgery in early stage disease, I think it would be appropriate to space out the surveillance visits.  SGO surveillance recommendation would be for every year.  I offered the patient both follow-up every 6 months versus 1 year with exam and Ca1 25.  We also discussed that while the risk of recurrence is decreased with completion surgery, recurrence can an can be quite late.  The patient would like to plan on surveillance visits every 6 months.  I will plan to see her in the new year and have asked her to call in late October or November to get this appointment scheduled.  We reviewed signs and symptoms that would be concerning for recurrence and she knows to call the clinic sooner than that if she develops any.  The patient is scheduled to see Dr. Donzetta Matters in October for IVC filter removal.  22 minutes of total time was spent for this patient encounter, including preparation, face-to-face counseling with the patient and coordination of care, and documentation of the encounter.  Jeral Pinch, MD  Division of Gynecologic Oncology  Department of  Obstetrics and Gynecology  [facility]

## 2020-02-04 ENCOUNTER — Telehealth: Payer: Self-pay

## 2020-02-04 NOTE — Telephone Encounter (Signed)
Ms Gossen states that she has noticed some blood on the tissue after she urinates. No urinary burning,pain , or frequency. Afebrile. Told Ms Colarusso that spotting can occur after the surgery. The sutures in the vaginal cuff begin to dissolve and can cause spotting. She has not done any heavy lifting or straining She needs to call the office if the bleeding increases like a menstrual period. Pt verbalized understanding.

## 2020-02-09 DIAGNOSIS — M26602 Left temporomandibular joint disorder, unspecified: Secondary | ICD-10-CM | POA: Diagnosis not present

## 2020-02-09 DIAGNOSIS — H6592 Unspecified nonsuppurative otitis media, left ear: Secondary | ICD-10-CM | POA: Diagnosis not present

## 2020-04-01 ENCOUNTER — Other Ambulatory Visit (HOSPITAL_COMMUNITY)
Admission: RE | Admit: 2020-04-01 | Discharge: 2020-04-01 | Disposition: A | Discharge status: Home / Self Care | Payer: Medicare PPO | Source: Ambulatory Visit | Attending: Vascular Surgery | Admitting: Vascular Surgery

## 2020-04-01 DIAGNOSIS — Z01812 Encounter for preprocedural laboratory examination: Secondary | ICD-10-CM | POA: Insufficient documentation

## 2020-04-01 DIAGNOSIS — Z20822 Contact with and (suspected) exposure to covid-19: Secondary | ICD-10-CM | POA: Diagnosis not present

## 2020-04-01 LAB — SARS CORONAVIRUS 2 (TAT 6-24 HRS): SARS Coronavirus 2: NEGATIVE

## 2020-04-03 ENCOUNTER — Encounter (HOSPITAL_COMMUNITY)
Admission: RE | Disposition: A | Discharge status: Home / Self Care | Payer: Self-pay | Source: Home / Self Care | Attending: Vascular Surgery

## 2020-04-03 ENCOUNTER — Other Ambulatory Visit: Payer: Self-pay

## 2020-04-03 ENCOUNTER — Encounter (HOSPITAL_COMMUNITY): Payer: Self-pay | Admitting: Vascular Surgery

## 2020-04-03 ENCOUNTER — Ambulatory Visit (HOSPITAL_COMMUNITY)
Admission: RE | Admit: 2020-04-03 | Discharge: 2020-04-03 | Disposition: A | Discharge status: Home / Self Care | Payer: Medicare PPO | Attending: Vascular Surgery | Admitting: Vascular Surgery

## 2020-04-03 DIAGNOSIS — Z888 Allergy status to other drugs, medicaments and biological substances status: Secondary | ICD-10-CM | POA: Diagnosis not present

## 2020-04-03 DIAGNOSIS — Z86718 Personal history of other venous thrombosis and embolism: Secondary | ICD-10-CM | POA: Diagnosis not present

## 2020-04-03 DIAGNOSIS — Z87891 Personal history of nicotine dependence: Secondary | ICD-10-CM | POA: Diagnosis not present

## 2020-04-03 DIAGNOSIS — Z7901 Long term (current) use of anticoagulants: Secondary | ICD-10-CM | POA: Diagnosis not present

## 2020-04-03 DIAGNOSIS — Z4589 Encounter for adjustment and management of other implanted devices: Secondary | ICD-10-CM | POA: Insufficient documentation

## 2020-04-03 DIAGNOSIS — I871 Compression of vein: Secondary | ICD-10-CM | POA: Diagnosis not present

## 2020-04-03 DIAGNOSIS — Z79899 Other long term (current) drug therapy: Secondary | ICD-10-CM | POA: Diagnosis not present

## 2020-04-03 DIAGNOSIS — E785 Hyperlipidemia, unspecified: Secondary | ICD-10-CM | POA: Diagnosis not present

## 2020-04-03 HISTORY — PX: IVC FILTER REMOVAL: CATH118246

## 2020-04-03 LAB — POCT I-STAT, CHEM 8
BUN: 22 mg/dL (ref 8–23)
Calcium, Ion: 1.19 mmol/L (ref 1.15–1.40)
Chloride: 107 mmol/L (ref 98–111)
Creatinine, Ser: 0.8 mg/dL (ref 0.44–1.00)
Glucose, Bld: 100 mg/dL — ABNORMAL HIGH (ref 70–99)
HCT: 36 % (ref 36.0–46.0)
Hemoglobin: 12.2 g/dL (ref 12.0–15.0)
Potassium: 4.1 mmol/L (ref 3.5–5.1)
Sodium: 142 mmol/L (ref 135–145)
TCO2: 26 mmol/L (ref 22–32)

## 2020-04-03 SURGERY — Surgical Case
Anesthesia: *Unknown

## 2020-04-03 SURGERY — IVC FILTER REMOVAL
Anesthesia: LOCAL

## 2020-04-03 MED ORDER — SODIUM CHLORIDE 0.9 % IV SOLN: INTRAVENOUS | Status: DC

## 2020-04-03 MED ORDER — MORPHINE SULFATE (PF) 2 MG/ML IV SOLN: 2.0000 mg | INTRAVENOUS | Status: DC | PRN

## 2020-04-03 MED ORDER — HEPARIN (PORCINE) IN NACL 1000-0.9 UT/500ML-% IV SOLN
INTRAVENOUS | Status: AC
  Filled 2020-04-03: qty 500

## 2020-04-03 MED ORDER — LABETALOL HCL 5 MG/ML IV SOLN: 10.0000 mg | INTRAVENOUS | Status: DC | PRN

## 2020-04-03 MED ORDER — LIDOCAINE HCL (PF) 1 % IJ SOLN
INTRAMUSCULAR | Status: DC | PRN
  Administered 2020-04-03: 5 mL via INTRADERMAL

## 2020-04-03 MED ORDER — OXYCODONE HCL 5 MG PO TABS: 5.0000 mg | ORAL_TABLET | ORAL | Status: DC | PRN

## 2020-04-03 MED ORDER — FENTANYL CITRATE (PF) 100 MCG/2ML IJ SOLN
INTRAMUSCULAR | Status: AC
  Filled 2020-04-03: qty 2

## 2020-04-03 MED ORDER — IODIXANOL 320 MG/ML IV SOLN
INTRAVENOUS | Status: DC | PRN
  Administered 2020-04-03: 40 mL via INTRAVENOUS

## 2020-04-03 MED ORDER — MIDAZOLAM HCL 2 MG/2ML IJ SOLN
INTRAMUSCULAR | Status: AC
  Filled 2020-04-03: qty 2

## 2020-04-03 MED ORDER — LIDOCAINE HCL (PF) 1 % IJ SOLN
INTRAMUSCULAR | Status: AC
  Filled 2020-04-03: qty 30

## 2020-04-03 MED ORDER — ACETAMINOPHEN 325 MG PO TABS: 650.0000 mg | ORAL_TABLET | ORAL | Status: DC | PRN

## 2020-04-03 MED ORDER — HYDRALAZINE HCL 20 MG/ML IJ SOLN: 5.0000 mg | INTRAMUSCULAR | Status: DC | PRN

## 2020-04-03 MED ORDER — MIDAZOLAM HCL 2 MG/2ML IJ SOLN
INTRAMUSCULAR | Status: DC | PRN
  Administered 2020-04-03 (×3): 1 mg via INTRAVENOUS

## 2020-04-03 MED ORDER — HEPARIN (PORCINE) IN NACL 1000-0.9 UT/500ML-% IV SOLN
INTRAVENOUS | Status: DC | PRN
  Administered 2020-04-03: 500 mL

## 2020-04-03 MED ORDER — FENTANYL CITRATE (PF) 100 MCG/2ML IJ SOLN
INTRAMUSCULAR | Status: AC
Start: 2020-04-03 — End: ?
  Filled 2020-04-03: qty 2

## 2020-04-03 MED ORDER — SODIUM CHLORIDE 0.9 % IV SOLN: 250.0000 mL | INTRAVENOUS | Status: DC | PRN

## 2020-04-03 MED ORDER — ONDANSETRON HCL 4 MG/2ML IJ SOLN: 4.0000 mg | Freq: Four times a day (QID) | INTRAMUSCULAR | Status: DC | PRN

## 2020-04-03 MED ORDER — SODIUM CHLORIDE 0.9% FLUSH: 3.0000 mL | INTRAVENOUS | Status: DC | PRN

## 2020-04-03 MED ORDER — FENTANYL CITRATE (PF) 100 MCG/2ML IJ SOLN
INTRAMUSCULAR | Status: DC | PRN
Start: 2020-04-03 — End: 2020-04-03
  Administered 2020-04-03 (×3): 50 ug via INTRAVENOUS

## 2020-04-03 MED ORDER — SODIUM CHLORIDE 0.9% FLUSH: 3.0000 mL | Freq: Two times a day (BID) | INTRAVENOUS | Status: DC

## 2020-04-03 SURGICAL SUPPLY — 14 items
CATH ANGIO 5F BER2 100CM (CATHETERS) ×1 IMPLANT
CATH ANGIO 5F BER2 65CM (CATHETERS) ×1 IMPLANT
CATH ANGIO 5F SIM1 100CM (CATHETERS) ×1 IMPLANT
DEVICE ONE SNARE 25MM (VASCULAR PRODUCTS) ×1 IMPLANT
GUIDEWIRE ANGLED .035X260CM (WIRE) ×1 IMPLANT
KIT ENCORE 26 ADVANTAGE (KITS) ×1 IMPLANT
KIT MICROPUNCTURE NIT STIFF (SHEATH) ×2 IMPLANT
KIT PV (KITS) ×2 IMPLANT
PROTECTION STATION PRESSURIZED (MISCELLANEOUS) ×2
SET VENACAVA FILTER RETRIEVAL (MISCELLANEOUS) ×2 IMPLANT
SHEATH PROBE COVER 6X72 (BAG) ×1 IMPLANT
STATION PROTECTION PRESSURIZED (MISCELLANEOUS) IMPLANT
TRANSDUCER W/STOPCOCK (MISCELLANEOUS) ×2 IMPLANT
TRAY PV CATH (CUSTOM PROCEDURE TRAY) ×2 IMPLANT

## 2020-04-03 NOTE — Progress Notes (Addendum)
Discharge instructions reviewed with pt and her daughter (via telephone) both voice understanding.  

## 2020-04-03 NOTE — H&P (Signed)
HPI:  Bethany Brogan Shroyeris a 69 y.o.femaleand extensive left lower extremity DVT in April. After further discussion we elected to undergo CT venogram. She now follows up for further evaluation and review of her CT. She does remain on Eliquis. She denies any lower extremity swelling at this time.      Past Medical History:  Diagnosis Date  . DVT (deep venous thrombosis) ([place])   . Hyperlipidemia    History reviewed. No pertinent family history.      Past Surgical History:  Procedure Laterality Date  . BREAST ENHANCEMENT SURGERY      Short Social History: Social History        Tobacco Use  . Smoking status: Former Research scientist (life sciences)  . Smokeless tobacco: Never Used  Substance Use Topics  . Alcohol use: Yes    Comment: social         Allergies  Allergen Reactions  . Atropine     convulsion          Current Outpatient Medications  Medication Sig Dispense Refill  . apixaban (ELIQUIS) 5 MG TABS tablet Take 2 tablets (10mg ) twice daily for 7 days, then 1 tablet (5mg ) twice daily 60 tablet 0  . atorvastatin (LIPITOR) 20 MG tablet Take 20 mg by mouth daily.    Marland Kitchen lisinopril (ZESTRIL) 10 MG tablet Take 10 mg by mouth daily.    . RESTASIS 0.05 % ophthalmic emulsion Place 1 drop into both eyes 2 (two) times daily.     No current facility-administered medications for this visit.    Review of Systems Constitutional:Constitutional negative. HENT:HENT negative.  Eyes:Eyes negative.  Respiratory:Respiratory negative.  Cardiovascular:Cardiovascular negative.  WG:[id number] negative.  Musculoskeletal:Musculoskeletal negative.  Skin:Skin negative.  Neurological:Neurological negative. Hematologic:Hematologic/lymphatic negative.  Psychiatric:Psychiatric negative.     Objective:      Vitals:   01/03/20 0941  BP: 133/66  Pulse: (!) 112  Resp: 14  SpO2: 98%     Physical Exam HENT:    Head: Normocephalic.  Nose:  Pulmonary:  Effort: Pulmonary effort is normal.  Abdominal:  General: Abdomen is flat.  Palpations: Abdomen is soft. There is nomass.  Musculoskeletal:  General: Normal range of motion.  Cervical back: Normal range of motionand neck supple.  Right lower leg: No edema.  Left lower leg: No edema.  Skin: General: Skin is warm.  Capillary Refill: Capillary refill takes less than 2 seconds.  Neurological:  General: No focal deficitpresent.  Mental Status: She is alert.  Psychiatric:  Mood and Affect: Moodnormal.  Thought Content: Thought contentnormal.  Judgment: Judgmentnormal.     Data: CT venogram IMPRESSION: 1. 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 Findings are suggestive for May-Thurner syndrome. The other abdominal and pelvic venous structures are patent without evidence of acute deep venous thrombosis. 2. An incidental finding of potential clinical significance has been found. Unusual appearance of the uterus and right adnexa. Findings could be related to uterine fibroid disease but indeterminate. In addition, there are appears to be a right adnexal cyst which would be atypical for a patient of this age.  3. 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 4. Aortic Atherosclerosis (ICD10-I70.0).    Assessment/Plan:     69 year old female with apparent May Thurner syndrome recent large left lower extremity DVT. Swelling has resolved. Planning for ivc filter removal today. Will finish eliquis Rx and then 81mg  asa indefinitely.     Georgia Dom CainMD [facility]

## 2020-04-03 NOTE — Op Note (Signed)
    Patient name: Bethany Simon MRN: [id number] DOB: 30-Jun-1950 Sex: female  04/03/2020 Pre-operative Diagnosis: May Thurner syndrome, placement of IVC filter for ovarian mass removal Post-operative diagnosis:  Same Surgeon:  Eda Paschal. Donzetta Matters, MD Procedure Performed: 1.  Ultrasound-guided cannulation right internal jugular vein 2.  IVC venogram 3.  Removal of IVC filter 4.  Moderate sedation with fentanyl and Versed for 52 minutes  Indications: 69 year old female with history of May Thurner syndrome previously had extensive left lower extremity DVT.  Her swelling has subsequently resolved.  She had an ovarian mass removed she was indicated for IVC filter.  She is now indicated for removal.  After this removal we will plan to complete her Eliquis through this month and she will transition to aspirin indefinitely.  Findings: The IVC filter hook had migrated to the right renal vein.  We were able to place a wire up and around the filter to straighten it out and then were able to snare the hook and remove the filter and it was intact.   Procedure:  The patient was identified in the holding area and taken to room 8.  The patient was then placed supine on the table and prepped and draped in the usual sterile fashion.  A time out was called.  Ultrasound was used to evaluate the right IJ which was noted to be patent and compressible.  The area was anesthetized 1% lidocaine.  Moderate sedation with fentanyl and Versed was administered for total 52 minutes her vital signs were monitored throughout the case.  The vein was cannulated with micropuncture needle followed the wire sheath and images saved the permanent record.  We placed a long stiff wire and placed the sheath down to the level of the filter which was noted to be tilted likely into the right renal vein.  We asked to perform venogram at that time it did appear that the hook was embedded.  We are able to place a wire down around the filter and get  this back through the sheath through and through access.  We then straighten the filter out.  We were then able to use a snare side-by-side we were able to snare the hook.  At this time we remove the wire.  We then passed the sheath over the snare to remove the filter.  Completion venogram demonstrated no extravasation.  Filter was intact.  Sheath was removed and pressure was held till hemostasis obtained.  She tolerated procedure without immediate complication.  Contrast: 40 cc   Bethany Medine C. Donzetta Matters, MD [facility] Office: [phone] Pager: [phone]

## 2020-04-03 NOTE — Progress Notes (Addendum)
Dr Donzetta Matters called states pt can be d/c at 1100 Attempted to call pt husband, unable to leave a message mail box was full.

## 2020-04-03 NOTE — Discharge Instructions (Signed)
Moderate Conscious Sedation, Adult, Care After These instructions provide you with information about caring for yourself after your procedure. Your health care provider may also give you more specific instructions. Your treatment has been planned according to current medical practices, but problems sometimes occur. Call your health care provider if you have any problems or questions after your procedure. What can I expect after the procedure? After your procedure, it is common:  To feel sleepy for several hours.  To feel clumsy and have poor balance for several hours.  To have poor judgment for several hours.  To vomit if you eat too soon. Follow these instructions at home: For at least 24 hours after the procedure:   Do not: ? Participate in activities where you could fall or become injured. ? Drive. ? Use heavy machinery. ? Drink alcohol. ? Take sleeping pills or medicines that cause drowsiness. ? Make important decisions or sign legal documents. ? Take care of children on your own.  Rest. Eating and drinking  Follow the diet recommended by your health care provider.  If you vomit: ? Drink water, juice, or soup when you can drink without vomiting. ? Make sure you have little or no nausea before eating solid foods. General instructions  Have a responsible adult stay with you until you are awake and alert.  Take over-the-counter and prescription medicines only as told by your health care provider.  If you smoke, do not smoke without supervision.  Keep all follow-up visits as told by your health care provider. This is important. Contact a health care provider if:  You keep feeling nauseous or you keep vomiting.  You feel light-headed.  You develop a rash.  You have a fever. Get help right away if:  You have trouble breathing. This information is not intended to replace advice given to you by your health care provider. Make sure you discuss any questions you have  with your health care provider. Document Revised: 05/23/2017 Document Reviewed: 09/30/2015 Elsevier Patient Education  2020 [employer] Inferior Vena Cava Filter Removal  Inferior vena cava filter removal is a procedure to take out a metal filter that was placed into a large vein in the abdomen (inferior vena cava, IVC). An IVC filter prevents blood clots in the legs or pelvis from traveling to the heart or lungs. Some IVC filters are designed to be removed (retrievable filters). You may have your filter removed when the danger of forming blood clots has passed or when you can take blood-thinning medicine to prevent blood clots. In some cases, the filter may need to be removed because it becomes damaged, is not working, or is causing problems. Most filters can be removed through the vein (percutaneous). In the rare cases when a surgeon is unable to remove the filter percutaneously, one of these steps may be taken:  A more invasive, open surgery may be necessary.  The filter may be left in place. Tell a health care provider about:  Any allergies you have.  All medicines you are taking, including vitamins, herbs, eye drops, creams, and over-the-counter medicines.  Any problems you or family members have had with anesthetic medicines or with contrast dyes that are used during an imaging test.  Any blood disorders you have.  Any surgeries you have had.  Any medical conditions you have.  Whether you are pregnant or may be pregnant. What are the risks? Generally, this is a safe procedure. However, problems may occur, including:  Infection.  Bleeding.    Allergic reactions to medicines or dyes.  Damage to the IVC, other blood vessels, or surrounding structures.  A blood clot or a piece of the filter breaking loose and traveling to the heart or lungs. What happens before the procedure? Medicines Ask your health care provider about:  Changing or stopping your regular medicines.  This is especially important if you are taking diabetes medicines or blood thinners.  Taking medicines such as aspirin and ibuprofen. These medicines can thin your blood. Do not take these medicines unless your health care provider tells you to take them.  Taking over-the-counter medicines, vitamins, herbs, and supplements. Staying hydrated Follow instructions from your health care provider about hydration, which may include:  Up to 2 hours before the procedure - you may continue to drink clear liquids, such as water or clear fruit juice. Eating and drinking restrictions Follow instructions from your health care provider about eating and drinking, which may include:  8 hours before the procedure - stop eating heavy meals or foods such as meat, fried foods, or fatty foods.  6 hours before the procedure - stop eating light meals or foods, such as toast or cereal.  6 hours before the procedure - stop drinking milk or drinks that contain milk.  2 hours before the procedure - stop drinking clear liquids. General instructions  Plan to have someone take you home from the hospital or clinic.  Plan to have a responsible adult care for you for at least 24 hours after you leave the hospital or clinic. This is important. What happens during the procedure?  To lower your risk of infection: ? Your health care team will wash or sanitize their hands. ? Hair may be removed from the surgical area. ? Your skin will be washed with soap.  An IV will be inserted into one of your veins.  You will be given one or more of the following: ? A medicine to help you relax (sedative). ? A medicine to make you fall asleep (general anesthetic).  The procedure will be done through a vein in your groin or neck that leads to the IVC. Your health care provider will inject a numbing medicine (local anesthetic) into the skin over the vein that will be used.  A small incision will be made over the vein.  A long,  thin tube (catheter) will be inserted into the vein.  The catheter will be moved through your vein and into your IVC. X-rays may be done to help guide the catheter into place. Dye may be injected through the catheter before the X-rays to make the catheter and filter easier to see.  When the catheter reaches the filter, a hook (snare) on the end of the catheter may be used to latch onto the filter. In some cases, a grasping instrument (forceps) may be threaded through the catheter to gently grab and remove the filter instead.  After the filter has been hooked or grasped, the filter and instruments will be pulled out through the catheter.  The catheter will be removed through the incision in your skin.  Pressure will be placed over your incision until bleeding stops.  A bandage (dressing) will be placed over your incision. The procedure may vary among health care providers and hospitals. What happens after the procedure?  Your blood pressure, heart rate, breathing rate, and blood oxygen level will be monitored until the medicines you were given have worn off.  Do not drive for 24 hours if you were given   a sedative during your procedure.  You may need to stay in bed (be on bed rest) for a period of time. Summary  Inferior vena cava (IVC) filter removal is a procedure to take out a filter that was placed to prevent blood clots from traveling to your heart or lungs.  You may have your filter removed when the danger of forming blood clots has passed or when you can take blood-thinning medicines to prevent blood clots. In some cases, a filter is removed because there is a problem with it.  The removal procedure is similar to the procedure that was used to insert the filter. A long, thin tube (catheter) will be inserted through a vein in your groin or neck. Then, the filter will be gently grasped and pulled out through the catheter.  Plan to have a responsible adult care for you for at least 24  hours after you leave the hospital or clinic. This is important. This information is not intended to replace advice given to you by your health care provider. Make sure you discuss any questions you have with your health care provider. Document Revised: 05/23/2017 Document Reviewed: 12/24/2016 Elsevier Patient Education  2020 [employer]  

## 2020-04-03 NOTE — Progress Notes (Signed)
Dr Donzetta Matters called about when to restart Eliquis states to restart tomorrow. Pt informed

## 2020-04-26 ENCOUNTER — Other Ambulatory Visit: Payer: Self-pay | Admitting: Family Medicine

## 2020-04-26 DIAGNOSIS — Z0000 Encounter for general adult medical examination without abnormal findings: Secondary | ICD-10-CM

## 2020-04-27 DIAGNOSIS — Z0000 Encounter for general adult medical examination without abnormal findings: Secondary | ICD-10-CM | POA: Diagnosis not present

## 2020-04-27 DIAGNOSIS — F419 Anxiety disorder, unspecified: Secondary | ICD-10-CM | POA: Diagnosis not present

## 2020-04-27 DIAGNOSIS — I871 Compression of vein: Secondary | ICD-10-CM | POA: Diagnosis not present

## 2020-04-27 DIAGNOSIS — Z86718 Personal history of other venous thrombosis and embolism: Secondary | ICD-10-CM | POA: Diagnosis not present

## 2020-04-27 DIAGNOSIS — I10 Essential (primary) hypertension: Secondary | ICD-10-CM | POA: Diagnosis not present

## 2020-04-27 DIAGNOSIS — L719 Rosacea, unspecified: Secondary | ICD-10-CM | POA: Diagnosis not present

## 2020-04-27 DIAGNOSIS — E785 Hyperlipidemia, unspecified: Secondary | ICD-10-CM | POA: Diagnosis not present

## 2020-04-27 DIAGNOSIS — Z86010 Personal history of colonic polyps: Secondary | ICD-10-CM | POA: Diagnosis not present

## 2020-05-02 ENCOUNTER — Telehealth: Payer: Self-pay | Admitting: Hematology and Oncology

## 2020-05-02 NOTE — Telephone Encounter (Signed)
Received a new hem referral from Dr. Dema Severin for: Personal history of other venous thrombosis and embolism. Bethany Simon has been cld and scheduled to see Dr. Chryl Heck on 12/1 at [place]. Pt aware to arrive 15 minutes early.

## 2020-05-15 ENCOUNTER — Telehealth: Payer: Self-pay | Admitting: *Deleted

## 2020-05-15 NOTE — Telephone Encounter (Signed)
Patient called and scheduled a follow up appt with at lab for February

## 2020-05-21 NOTE — Progress Notes (Signed)
[place] NOTE  Patient Care Team: Harlan Stains, MD as PCP - General (Family Medicine)  CHIEF COMPLAINTS/PURPOSE OF CONSULTATION:  History of VTE  ASSESSMENT & PLAN:  No problem-specific Assessment & Plan notes found for this encounter.  This is a very pleasant 69 year old female patient who presented with chief complaint of left lower extremity pain and swelling in April 2021, found to have extensive DVT of the left lower extremity, state on Eliquis for 6 months and now referred to hematology for any additional anticoagulation recommendations.  There is no clear etiology for the left lower extremity DVT 22 2021 although there is a question if the ovarian pathology has hypercoagulable state and contributed to the left lower extremity DVT.  No prior history of DVT or PE.  No family history of hypercoagulable state.  Given the extensive DVT and no clear etiology, recommended hypercoagulable work-up.  This has been ordered and she is agreeable to it. We have discussed about available options for anticoagulation.  She is currently on baby aspirin daily.  We have discussed about questionable role of prophylactic dose of Eliquis in this case given the extensive DVT and given the question of May Thurner syndrome which may create ongoing risk for future DVT in that leg.  She understands that there is some risk of bleeding with the blood thinners and she is comfortable with option of aspirin and considering anticoagulation again if she develops any new symptoms. We also discussed about age-appropriate cancer screening and considering genetic testing given her borderline ovarian pathology and family history of breast cancer.  She would like to talk to her primary care doctor and her gynecologist about this and decide.  She was encouraged to talk to the PCP for referral. We will do a video visit to review labs and to discuss any additional recommendations. She may have to stay in  touch with her pain specialist if she has a recurrence and if this is still attributed to possible may Thurner syndrome because some of these patients may require a corrective surgery in addition to anticoagulation. Thank you for consulting [place] in the care of this patient.  Please do not hesitate to contact us with any additional questions or concerns. All her questions were answered  Orders Placed This Encounter  Procedures  . Factor 5 leiden  . Prothrombin gene mutation  . Antithrombin III*  . Lupus anticoagulant panel  . Beta-2-glycoprotein i abs, IgG/M/A    Standing Status:   Future    Standing Expiration Date:   05/24/2021  . Cardiolipin antibodies, IgG, IgM, IgA*    Standing Status:   Future    Standing Expiration Date:   05/24/2021  . Protein C activity*    Standing Status:   Future    Standing Expiration Date:   05/24/2021  . Protein S activity*    Standing Status:   Future    Standing Expiration Date:   05/24/2021     HISTORY OF PRESENTING ILLNESS:  Bethany Simon 69 y.o. female is here because of DVT/PE  This is a very pleasant 69 year old female patient with past medical history significant for acute left lower extremity DVT first diagnosed in April 2021, she presented to the ED with chief complaint of left leg swelling and pain.  She had an ultrasound which showed acute DVT involving the left common femoral vein, saphenofemoral junction, left proximal profunda vein, left posterior tibial veins, left popliteal veins and left peroneal veins. She was started  on Eliquis at the time and completed the course of 6 months until November 2021.  She is now on baby aspirin.  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  She was wondering if having ovarian pathology could have contributed to her DVT.  She was also thought to have may Thurner syndrome but never had any prior left lower  extremity DVTs while she was young.  No peripartum or postpartum DVTs. No family history of DVT or PE.  Family history significant for breast cancer in mom, breast cancer in maternal grandmother, breast cancer maternal aunt most of them diagnosed in 92s. 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  No involvement of uterine corpus, cervix and fallopian tubes. She is following up with her doctor for this pathology She denies any current symptoms.  She feels well.  No ongoing chest pain, lower extremity swelling, shortness of breath, hematochezia, melena or hematuria.  She is scheduled for mammogram next month and she has to schedule her colonoscopy soon, she is due for it. Rest of the pertinent 10 point ROS as mentioned below and negative.  REVIEW OF SYSTEMS:   Constitutional: Denies fevers, chills or abnormal night sweats Eyes: Denies blurriness of vision, double vision or watery eyes Ears, nose, mouth, throat, and face: Denies mucositis or sore throat Respiratory: Denies cough, dyspnea or wheezes Cardiovascular: Denies palpitation, chest discomfort or lower extremity swelling Gastrointestinal:  Denies nausea, heartburn or change in bowel habits Skin: Denies abnormal skin rashes Lymphatics: Denies new lymphadenopathy or easy bruising Neurological:Denies numbness, tingling or new weaknesses Behavioral/Psych: Mood is stable, no new changes  All other systems were reviewed with the patient and are negative.  MEDICAL HISTORY:  Past Medical History:  Diagnosis Date  . Arthritis   . Complication of anesthesia   . DVT (deep venous thrombosis) ([place])   . GERD (gastroesophageal reflux disease)   . Hyperlipidemia   . Hypertension   . May-Thurner syndrome   . Osteopenia   . Pelvic mass   . PONV (postoperative  nausea and vomiting)   . SCC (squamous cell carcinoma)    skin    SURGICAL HISTORY: Past Surgical History:  Procedure Laterality Date  . BREAST ENHANCEMENT SURGERY    . BREAST SURGERY    . CATARACT EXTRACTION Bilateral 2016  . IVC FILTER INSERTION N/A 01/03/2020   Procedure: IVC FILTER INSERTION;  Surgeon: [name], MD;  Location: [facility];  Service: Cardiovascular;  Laterality: N/A;  . IVC FILTER REMOVAL N/A 04/03/2020   Procedure: IVC FILTER REMOVAL;  Surgeon: [name], MD;  Location: [facility];  Service: Cardiovascular;  Laterality: N/A;  . IVC VENOGRAPHY N/A 01/03/2020   Procedure: IVC Venography;  Surgeon: [name], MD;  Location: [facility];  Service: Cardiovascular;  Laterality: N/A;  . LAPAROSCOPIC LYSIS OF ADHESIONS  01/06/2020   Procedure: ROBOTIC LAPAROSCOPIC LYSIS OF ADHESIONS;  Surgeon: [name], MD;  Location: [facility];  Service: Gynecology;;  . LEEP  2000  . ROBOTIC ASSISTED TOTAL HYSTERECTOMY WITH BILATERAL SALPINGO OOPHERECTOMY Bilateral 01/06/2020   Procedure: XI ROBOTIC ASSISTED TOTAL HYSTERECTOMY WITH BILATERAL SALPINGO OOPHORECTOMY  POSLAPAROTOMY;  Surgeon: [name], MD;  Location: [facility];  Service: Gynecology;  Laterality: Bilateral;    SOCIAL HISTORY: Social History   Socioeconomic History  . Marital status: Married    Spouse name: Not on file  . Number of children: Not on file  . Years of education: Not on file  . Highest education level: Not on file  Occupational History  . Occupation: retired  Tobacco Use  . Smoking status: Former Smoker    Quit date: 1979    Years since quitting: 42.9  . Smokeless tobacco: Never Used  Vaping Use  . Vaping Use: Never used  Substance and Sexual Activity  . Alcohol use: Yes    Comment: social  . Drug use: Never  . Sexual activity: Not Currently  Other Topics Concern  . Not on file  Social History Narrative  . Not on  file   Social Determinants of Health   Financial Resource Strain:   . Difficulty of Paying Living Expenses: Not on file  Food Insecurity:   . Worried About Charity fundraiser in the Last Year: Not on file  . Ran Out of Food in the Last Year: Not on file  Transportation Needs:   . Lack of Transportation (Medical): Not on file  . Lack of Transportation (Non-Medical): Not on file  Physical Activity:   . Days of Exercise per Week: Not on file  . Minutes of Exercise per Session: Not on file  Stress:   . Feeling of Stress : Not on file  Social Connections:   . Frequency of Communication with Friends and Family: Not on file  . Frequency of Social Gatherings with Friends and Family: Not on file  . Attends Religious Services: Not on file  . Active Member of Clubs or Organizations: Not on file  . Attends Archivist Meetings: Not on file  . Marital Status: Not on file  Intimate Partner Violence:   . Fear of Current or Ex-Partner: Not on file  . Emotionally Abused: Not on file  . Physically Abused: Not on file  . Sexually Abused: Not on file    FAMILY HISTORY: Family History  Problem Relation Age of Onset  . Cancer Mother        breast, dx in last 49s  . Hypertension Mother   . Diabetes Mother        pre diabetes  . Hypertension Father   . Cancer Brother        brain  . Diabetes Brother   . Cancer Maternal Aunt        possible breast  . Cancer Daughter        thyroid  . Cancer Paternal Uncle        colon  . Breast cancer Maternal Grandmother        dx in 61s    ALLERGIES:  is allergic to atropine.  MEDICATIONS:  Current Outpatient Medications  Medication Sig Dispense Refill  . acetaminophen (TYLENOL) 500 MG tablet Take 500-1,000 mg by mouth every 8 (eight) hours as needed for moderate pain or headache.     . ALPRAZolam (XANAX) 0.5 MG tablet     . aspirin EC 81 MG tablet Take 81 mg by mouth daily. Swallow whole.    Marland Kitchen atorvastatin (LIPITOR) 20 MG tablet Take  20 mg by mouth every evening.     Marland Kitchen doxycycline (DORYX) 100 MG EC tablet Take 100 mg by mouth 2 (two) times daily as needed (rosacea flare ups).     Marland Kitchen lisinopril (ZESTRIL) 10  MG tablet Take 10 mg by mouth daily.    . Multiple Vitamins-Minerals (PRESERVISION AREDS 2 PO) Take 1 tablet by mouth in the morning and at bedtime.    . polyethylene glycol (MIRALAX / GLYCOLAX) 17 g packet Take 17 g by mouth daily as needed (constipation.).    Marland Kitchen RESTASIS 0.05 % ophthalmic emulsion Place 1 drop into both eyes 2 (two) times daily as needed (dry/irritated eyes.).      No current facility-administered medications for this visit.    PHYSICAL EXAMINATION: ECOG PERFORMANCE STATUS: 0 - Asymptomatic  Vitals:   05/24/20 0943  BP: (!) 151/68  Pulse: 74  Resp: 12  Temp: (!) 97.3 F (36.3 C)  SpO2: 100%   Filed Weights   05/24/20 0943  Weight: 203 lb (92.1 kg)    GENERAL:alert, no distress and comfortable SKIN: skin color, texture, turgor are normal, no rashes or significant lesions EYES: normal, conjunctiva are pink and non-injected, sclera clear OROPHARYNX:no exudate, no erythema and lips, buccal mucosa, and tongue normal  NECK: supple, thyroid normal size, non-tender, without nodularity LYMPH:  no palpable lymphadenopathy in the cervical, axillary or inguinal LUNGS: clear to auscultation and percussion with normal breathing effort HEART: regular rate & rhythm and no murmurs and no lower extremity edema ABDOMEN:abdomen soft, non-tender and normal bowel sounds Musculoskeletal:no cyanosis of digits and no clubbing . No asymmetry of LE,  PSYCH: alert & oriented x 3 with fluent speech NEURO: no focal motor/sensory deficits  LABORATORY DATA:  I have reviewed the data as listed Lab Results  Component Value Date   WBC 19.1 (H) 01/07/2020   HGB 12.2 04/03/2020   HCT 36.0 04/03/2020   MCV 91.8 01/07/2020   PLT 226 01/07/2020     Chemistry      Component Value Date/Time   NA 142 04/03/2020 0635    K 4.1 04/03/2020 0635   CL 107 04/03/2020 0635   CO2 24 01/07/2020 0441   BUN 22 04/03/2020 0635   CREATININE 0.80 04/03/2020 0635      Component Value Date/Time   CALCIUM 8.5 (L) 01/07/2020 0441   ALKPHOS 83 12/30/2019 0845   AST 17 12/30/2019 0845   ALT 19 12/30/2019 0845   BILITOT 0.5 12/30/2019 0845     12/11/2019 CT abdomen pelvis  IMPRESSION: 1. Heterogeneous mass within the pelvis with apparent areas of cystic or necrotic changes concerning for malignancy. Further evaluation with MRI without and with contrast is recommended. 2. No bowel obstruction. 3. Compression of the left common iliac vein between the right common iliac artery and spine as seen on the prior CT. 4. Aortic Atherosclerosis (ICD10-I70.0).  11/23/19  IMPRESSION: 1. 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 Findings are suggestive for May-Thurner syndrome. The other abdominal and pelvic venous structures are patent without evidence of acute deep venous thrombosis. 2. **An incidental finding of potential clinical significance has been found. Unusual appearance of the uterus and right adnexa. Findings could be related to uterine fibroid disease but indeterminate. In addition, there are appears to be a right adnexal cyst which would be atypical for a patient of this age. Recommend further characterization with pelvic ultrasound.** 3. 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 4.  Aortic Atherosclerosis (ICD10-I70.0).  I have reviewed pertinent medical records.  RADIOGRAPHIC STUDIES: I have personally reviewed the radiological images as listed and agreed with the findings in the report. I spent a total of 45 minutes in the care of this patient  including history and physical, review of records, counseling and coordination of care.  We have discussed about possible options including hypercoagulable work-up and options for anticoagulation. All questions were answered.  The patient knows to call the clinic with any problems, questions or concerns.    Benay Pike, MD 05/24/2020 10:00 AM

## 2020-05-24 ENCOUNTER — Other Ambulatory Visit: Payer: Self-pay

## 2020-05-24 ENCOUNTER — Inpatient Hospital Stay: Payer: Medicare PPO | Attending: Hematology and Oncology | Admitting: Hematology and Oncology

## 2020-05-24 ENCOUNTER — Encounter: Payer: Self-pay | Admitting: Hematology and Oncology

## 2020-05-24 ENCOUNTER — Telehealth: Payer: Self-pay | Admitting: Hematology and Oncology

## 2020-05-24 ENCOUNTER — Inpatient Hospital Stay: Payer: Medicare PPO

## 2020-05-24 VITALS — BP 151/68 | HR 74 | Temp 97.3°F | Resp 12 | Ht 67.0 in | Wt 203.0 lb

## 2020-05-24 DIAGNOSIS — I82422 Acute embolism and thrombosis of left iliac vein: Secondary | ICD-10-CM

## 2020-05-24 DIAGNOSIS — I82412 Acute embolism and thrombosis of left femoral vein: Secondary | ICD-10-CM | POA: Insufficient documentation

## 2020-05-24 DIAGNOSIS — Z803 Family history of malignant neoplasm of breast: Secondary | ICD-10-CM | POA: Diagnosis not present

## 2020-05-24 DIAGNOSIS — Z809 Family history of malignant neoplasm, unspecified: Secondary | ICD-10-CM | POA: Diagnosis not present

## 2020-05-24 DIAGNOSIS — Z7901 Long term (current) use of anticoagulants: Secondary | ICD-10-CM | POA: Insufficient documentation

## 2020-05-24 DIAGNOSIS — I82432 Acute embolism and thrombosis of left popliteal vein: Secondary | ICD-10-CM | POA: Diagnosis not present

## 2020-05-24 DIAGNOSIS — Z800 Family history of malignant neoplasm of digestive organs: Secondary | ICD-10-CM | POA: Insufficient documentation

## 2020-05-24 DIAGNOSIS — Z7982 Long term (current) use of aspirin: Secondary | ICD-10-CM | POA: Diagnosis not present

## 2020-05-24 DIAGNOSIS — Z87891 Personal history of nicotine dependence: Secondary | ICD-10-CM | POA: Diagnosis not present

## 2020-05-24 DIAGNOSIS — I82452 Acute embolism and thrombosis of left peroneal vein: Secondary | ICD-10-CM | POA: Insufficient documentation

## 2020-05-24 DIAGNOSIS — I82442 Acute embolism and thrombosis of left tibial vein: Secondary | ICD-10-CM | POA: Diagnosis not present

## 2020-05-24 DIAGNOSIS — Z90710 Acquired absence of both cervix and uterus: Secondary | ICD-10-CM | POA: Diagnosis not present

## 2020-05-24 DIAGNOSIS — Z90722 Acquired absence of ovaries, bilateral: Secondary | ICD-10-CM | POA: Insufficient documentation

## 2020-05-24 DIAGNOSIS — Z808 Family history of malignant neoplasm of other organs or systems: Secondary | ICD-10-CM | POA: Insufficient documentation

## 2020-05-24 LAB — ANTITHROMBIN III: AntiThromb III Func: 112 % (ref 75–120)

## 2020-05-24 NOTE — Telephone Encounter (Signed)
Scheduled appointments per 12/1 los. Spoke to patient who is aware of appointment date and time.  

## 2020-05-25 LAB — PROTEIN S ACTIVITY: Protein S Activity: 83 % (ref 63–140)

## 2020-05-25 LAB — LUPUS ANTICOAGULANT PANEL
DRVVT: 29.5 s (ref 0.0–47.0)
PTT Lupus Anticoagulant: 30.8 s (ref 0.0–51.9)

## 2020-05-25 LAB — PROTEIN C ACTIVITY: Protein C Activity: 170 % (ref 73–180)

## 2020-05-26 ENCOUNTER — Encounter: Payer: Self-pay | Admitting: Hematology and Oncology

## 2020-05-26 ENCOUNTER — Inpatient Hospital Stay (HOSPITAL_BASED_OUTPATIENT_CLINIC_OR_DEPARTMENT_OTHER): Payer: Medicare PPO | Admitting: Hematology and Oncology

## 2020-05-26 DIAGNOSIS — I82422 Acute embolism and thrombosis of left iliac vein: Secondary | ICD-10-CM

## 2020-05-26 DIAGNOSIS — I82432 Acute embolism and thrombosis of left popliteal vein: Secondary | ICD-10-CM | POA: Diagnosis not present

## 2020-05-26 DIAGNOSIS — I82452 Acute embolism and thrombosis of left peroneal vein: Secondary | ICD-10-CM

## 2020-05-26 DIAGNOSIS — I82412 Acute embolism and thrombosis of left femoral vein: Secondary | ICD-10-CM | POA: Diagnosis not present

## 2020-05-26 DIAGNOSIS — I82442 Acute embolism and thrombosis of left tibial vein: Secondary | ICD-10-CM | POA: Diagnosis not present

## 2020-05-26 LAB — CARDIOLIPIN ANTIBODIES, IGG, IGM, IGA
Anticardiolipin IgA: <9 APL U/mL (ref 0–11)
Anticardiolipin IgG: <9 GPL U/mL (ref 0–14)
Anticardiolipin IgM: <9 MPL U/mL (ref 0–12)

## 2020-05-26 LAB — BETA-2-GLYCOPROTEIN I ABS, IGG/M/A
Beta-2 Glyco I IgG: <9 GPI IgG units (ref 0–20)
Beta-2-Glycoprotein I IgA: <9 GPI IgA units (ref 0–25)
Beta-2-Glycoprotein I IgM: <9 GPI IgM units (ref 0–32)

## 2020-05-26 NOTE — Progress Notes (Signed)
Bethany Simon  Patient Care Team: Harlan Stains, MD as PCP - General (Family Medicine)  CHIEF COMPLAINTS/PURPOSE OF CONSULTATION:  History of VTE  ASSESSMENT & PLAN:  No problem-specific Assessment & Plan notes found for this encounter.  This is a very pleasant 69 year old female patient who presented with chief complaint of left lower extremity pain and swelling in April 2021, found to have extensive DVT of the left lower extremity, state on Eliquis for 6 months and now referred to hematology for any additional anticoagulation recommendations.  There is no clear etiology for the left lower extremity DVT 22 2021 although there is a question if the ovarian pathology has hypercoagulable state and contributed to the left lower extremity DVT.  No prior history of DVT or PE.  No family history of hypercoagulable state.  Given the extensive DVT and no clear etiology, recommended hypercoagulable work-up.   Factor V and prothombin gene pending, rest of the hypercoagulable work up is negative. If rest of the hypercoagulable work up is neg, she can remain on aspirin. We have discussed about available options for anticoagulation.  She is currently on baby aspirin daily.  We have discussed about questionable role of prophylactic dose of Eliquis in this case given the extensive DVT and given the question of May Thurner syndrome which may create ongoing risk for future DVT in that leg.  She understands that there is some risk of bleeding with the blood thinners and she is comfortable with option of aspirin and considering anticoagulation again if she develops any new symptoms. We also discussed about age-appropriate cancer screening and considering genetic testing given her borderline ovarian pathology and family history of breast cancer.  She would like to talk to her primary care doctor and her gynecologist about this and decide.  She has an appointment in February, will address it  there. She may have to stay in touch with her pain specialist if she has a recurrence and if this is still attributed to possible may Thurner syndrome because some of these patients may require a corrective surgery in addition to anticoagulation. Thank you for consulting [place] in the care of this patient.  Please do not hesitate to contact us with any additional questions or concerns. All her questions were answered  No orders of the defined types were placed in this encounter.   HISTORY OF PRESENTING ILLNESS:  Bethany Simon 69 y.o. female is here because of DVT/PE  This is a very pleasant 69 year old female patient with past medical history significant for acute left lower extremity DVT first diagnosed in April 2021, she presented to the ED with chief complaint of left leg swelling and pain.  She had an ultrasound which showed acute DVT involving the left common femoral vein, saphenofemoral junction, left proximal profunda vein, left posterior tibial veins, left popliteal veins and left peroneal veins. She was started on Eliquis at the time and completed the course of 6 months until November 2021.  She is now on baby aspirin.  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  She was wondering if having ovarian pathology could have contributed to her DVT.  She was also thought to have may Thurner syndrome but never had any prior left lower extremity DVTs while she was young.  No peripartum or postpartum DVTs. No family history of DVT or PE.  Family history significant  for breast cancer in mom, breast cancer in maternal grandmother, breast cancer maternal aunt most of them diagnosed in 13s. 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  No involvement of uterine corpus, cervix and fallopian tubes.  During her initial visit, we recommended hypercoagulable work up. She is here for a video visit to follow up on this. No new complaints since last visit.  REVIEW OF SYSTEMS:   Constitutional: Denies fevers, chills or abnormal night sweats Eyes: Denies blurriness of vision, double vision or watery eyes Ears, nose, mouth, throat, and face: Denies mucositis or sore throat Respiratory: Denies cough, dyspnea or wheezes Cardiovascular: Denies palpitation, chest discomfort or lower extremity swelling Gastrointestinal:  Denies nausea, heartburn or change in bowel habits Skin: Denies abnormal skin rashes Lymphatics: Denies new lymphadenopathy or easy bruising Neurological:Denies numbness, tingling or new weaknesses Behavioral/Psych: Mood is stable, no new changes  All other systems were reviewed with the patient and are negative.  MEDICAL HISTORY:  Past Medical History:  Diagnosis Date   Arthritis    Complication of anesthesia    DVT (deep venous thrombosis) (HCC)    GERD (gastroesophageal reflux disease)    Hyperlipidemia    Hypertension    May-Thurner syndrome    Osteopenia    Pelvic mass    PONV (postoperative nausea and vomiting)    SCC (squamous cell carcinoma)    skin    SURGICAL HISTORY: Past Surgical History:  Procedure Laterality Date   BREAST ENHANCEMENT SURGERY     BREAST SURGERY     CATARACT EXTRACTION Bilateral 2016   IVC FILTER INSERTION N/A 01/03/2020   Procedure: IVC FILTER INSERTION;  Surgeon: [name], MD;  Location: [facility];  Service: Cardiovascular;  Laterality: N/A;   IVC FILTER REMOVAL N/A 04/03/2020   Procedure: IVC FILTER REMOVAL;  Surgeon: [name], MD;  Location: [facility];  Service: Cardiovascular;  Laterality: N/A;   IVC VENOGRAPHY N/A 01/03/2020   Procedure: IVC Venography;  Surgeon: [name], MD;  Location: [facility];  Service: Cardiovascular;  Laterality: N/A;   LAPAROSCOPIC LYSIS OF ADHESIONS  01/06/2020   Procedure: ROBOTIC LAPAROSCOPIC LYSIS OF ADHESIONS;  Surgeon: [name], MD;  Location: [facility];  Service: Gynecology;;   LEEP  2000   ROBOTIC ASSISTED TOTAL HYSTERECTOMY WITH BILATERAL SALPINGO OOPHERECTOMY Bilateral 01/06/2020   Procedure: XI ROBOTIC ASSISTED TOTAL HYSTERECTOMY WITH BILATERAL SALPINGO OOPHORECTOMY  POSLAPAROTOMY;  Surgeon: [name], MD;  Location: [facility];  Service: Gynecology;  Laterality: Bilateral;    SOCIAL HISTORY: Social History   Socioeconomic History   Marital status: Married    Spouse name: Not on file   Number of children: Not on file   Years of education: Not on file   Highest education level: Not on file  Occupational History   Occupation: retired  Tobacco Use   Smoking status: Former Smoker    Quit date: 1979    Years since quitting: 42.9   Smokeless tobacco: Never Used  Scientific laboratory technician Use: Never used  Substance and Sexual Activity   Alcohol use: Yes    Comment: social   Drug use: Never   Sexual activity: Not Currently  Other Topics Concern   Not on file  Social History Narrative   Not on file  Social Determinants of Health   Financial Resource Strain:    Difficulty of Paying Living Expenses: Not on file  Food Insecurity:    Worried About Charity fundraiser in the Last Year: Not on file   [employer] of Food in the Last Year: Not on file  Transportation Needs:    Lack of Transportation (Medical): Not on file   Lack of Transportation (Non-Medical): Not on file  Physical Activity:    Days of Exercise per Week: Not on file   Minutes of Exercise per Session: Not on file  Stress:    Feeling of Stress : Not on file  Social Connections:    Frequency of Communication with Friends and Family: Not on file   Frequency of Social Gatherings with Friends and  Family: Not on file   Attends Religious Services: Not on file   Active Member of Clubs or Organizations: Not on file   Attends Archivist Meetings: Not on file   Marital Status: Not on file  Intimate Partner Violence:    Fear of Current or Ex-Partner: Not on file   Emotionally Abused: Not on file   Physically Abused: Not on file   Sexually Abused: Not on file    FAMILY HISTORY: Family History  Problem Relation Age of Onset   Cancer Mother        breast, dx in last 89s   Hypertension Mother    Diabetes Mother        pre diabetes   Hypertension Father    Cancer Brother        brain   Diabetes Brother    Cancer Maternal Aunt        possible breast   Cancer Daughter        thyroid   Cancer Paternal Uncle        colon   Breast cancer Maternal Grandmother        dx in 47s    ALLERGIES:  is allergic to atropine.  MEDICATIONS:  Current Outpatient Medications  Medication Sig Dispense Refill   acetaminophen (TYLENOL) 500 MG tablet Take 500-1,000 mg by mouth every 8 (eight) hours as needed for moderate pain or headache.      ALPRAZolam (XANAX) 0.5 MG tablet      aspirin EC 81 MG tablet Take 81 mg by mouth daily. Swallow whole.     atorvastatin (LIPITOR) 20 MG tablet Take 20 mg by mouth every evening.      doxycycline (DORYX) 100 MG EC tablet Take 100 mg by mouth 2 (two) times daily as needed (rosacea flare ups).      lisinopril (ZESTRIL) 10 MG tablet Take 10 mg by mouth daily.     Multiple Vitamins-Minerals (PRESERVISION AREDS 2 PO) Take 1 tablet by mouth in the morning and at bedtime.     polyethylene glycol (MIRALAX / GLYCOLAX) 17 g packet Take 17 g by mouth daily as needed (constipation.).     RESTASIS 0.05 % ophthalmic emulsion Place 1 drop into both eyes 2 (two) times daily as needed (dry/irritated eyes.).      No current facility-administered medications for this visit.    PHYSICAL EXAMINATION: ECOG PERFORMANCE STATUS: 0 -  Asymptomatic  There were no vitals filed for this visit. There were no vitals filed for this visit.  PE not performed. Video visit.  LABORATORY DATA:  I have reviewed the data as listed Lab Results  Component Value Date   WBC 19.1 (H) 01/07/2020  HGB 12.2 04/03/2020   HCT 36.0 04/03/2020   MCV 91.8 01/07/2020   PLT 226 01/07/2020     Chemistry      Component Value Date/Time   NA 142 04/03/2020 0635   K 4.1 04/03/2020 0635   CL 107 04/03/2020 0635   CO2 24 01/07/2020 0441   BUN 22 04/03/2020 0635   CREATININE 0.80 04/03/2020 0635      Component Value Date/Time   CALCIUM 8.5 (L) 01/07/2020 0441   ALKPHOS 83 12/30/2019 0845   AST 17 12/30/2019 0845   ALT 19 12/30/2019 0845   BILITOT 0.5 12/30/2019 0845     12/11/2019 CT abdomen pelvis  IMPRESSION: 1. Heterogeneous mass within the pelvis with apparent areas of cystic or necrotic changes concerning for malignancy. Further evaluation with MRI without and with contrast is recommended. 2. No bowel obstruction. 3. Compression of the left common iliac vein between the right common iliac artery and spine as seen on the prior CT. 4. Aortic Atherosclerosis (ICD10-I70.0).  11/23/19  IMPRESSION: 1. 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 Findings are suggestive for May-Thurner syndrome. The other abdominal and pelvic venous structures are patent without evidence of acute deep venous thrombosis. 2. **An incidental finding of potential clinical significance has been found. Unusual appearance of the uterus and right adnexa. Findings could be related to uterine fibroid disease but indeterminate. In addition, there are appears to be a right adnexal cyst which would be atypical for a patient of this age. Recommend further characterization with pelvic ultrasound.** 3. 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 4.  Aortic Atherosclerosis (ICD10-I70.0).  I have reviewed pertinent medical  records. Reviewed labs, so far neg hypercoagulable work up.  RADIOGRAPHIC STUDIES: I have personally reviewed the radiological images as listed and agreed with the findings in the report. I connected with  Anacaren Kohan Blucher on 05/26/20 by a video enabled telemedicine application and verified that I am speaking with the correct person using two identifiers.   I discussed the limitations of evaluation and management by telemedicine. The patient expressed understanding and agreed to proceed. I spent 15 minutes in the care of this patient, please dont hesitate to contact us with any additional questions or concerns.   Benay Pike, MD 05/26/2020 10:07 AM

## 2020-05-29 ENCOUNTER — Telehealth: Payer: Self-pay | Admitting: *Deleted

## 2020-05-29 LAB — PROTHROMBIN GENE MUTATION

## 2020-05-29 LAB — FACTOR 5 LEIDEN

## 2020-05-29 NOTE — Telephone Encounter (Signed)
TCT patient regarding lab results. No answer but was able to leave a detailed message on an denitrified voice mail. Advised that Bethany Simon's labs are good. No abnormalities. Also that her her Factor V and Prothrombin are negative.  Advised that she can call back with any questions or concerns.

## 2020-05-29 NOTE — Telephone Encounter (Signed)
-----   Message from Benay Pike, MD sent at 05/29/2020  3:23 PM EST ----- Beth Please let her know that her labs came back good.Factor V and Prothrombin are also negative.

## 2020-06-06 ENCOUNTER — Other Ambulatory Visit: Payer: Self-pay

## 2020-06-06 ENCOUNTER — Ambulatory Visit
Admission: RE | Admit: 2020-06-06 | Discharge: 2020-06-06 | Disposition: A | Discharge status: Home / Self Care | Payer: Medicare PPO | Source: Ambulatory Visit | Attending: Family Medicine | Admitting: Family Medicine

## 2020-06-06 DIAGNOSIS — Z0000 Encounter for general adult medical examination without abnormal findings: Secondary | ICD-10-CM

## 2020-06-06 DIAGNOSIS — Z1231 Encounter for screening mammogram for malignant neoplasm of breast: Secondary | ICD-10-CM | POA: Diagnosis not present

## 2020-06-07 DIAGNOSIS — Z1159 Encounter for screening for other viral diseases: Secondary | ICD-10-CM | POA: Diagnosis not present

## 2020-06-12 DIAGNOSIS — Z86010 Personal history of colonic polyps: Secondary | ICD-10-CM | POA: Diagnosis not present

## 2020-07-14 DIAGNOSIS — H04123 Dry eye syndrome of bilateral lacrimal glands: Secondary | ICD-10-CM | POA: Diagnosis not present

## 2020-07-14 DIAGNOSIS — H52203 Unspecified astigmatism, bilateral: Secondary | ICD-10-CM | POA: Diagnosis not present

## 2020-07-14 DIAGNOSIS — Z961 Presence of intraocular lens: Secondary | ICD-10-CM | POA: Diagnosis not present

## 2020-07-14 DIAGNOSIS — H26492 Other secondary cataract, left eye: Secondary | ICD-10-CM | POA: Diagnosis not present

## 2020-08-07 NOTE — Progress Notes (Unsigned)
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 CA-125 was elevated before surgery at 124. CEA was normal. 7/12: Patient underwent IVC filter placement given recent history of DVT in anticipation of surgery. 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 04/03/20: removal of IVC filter  Interval History: Patient reports doing well since her last visit with me.  She denies any changes in weight, decreased appetite, bloating, abdominal or pelvic pain.  She denies vaginal bleeding or discharge.  She reports regular bowel and bladder function.  Saw Dr. Chryl Heck on 12/1 for DVT/PE. Plan was for hypercoag work-up (negative).  She was subsequently taken off of her Eliquis.  Had mammogram on 06/09/20.  Her daughter is scheduled to have a C-section for twins this Thursday.  Bethany Simon is going to be her support person.  Past Medical/Surgical History: Past Medical History:  Diagnosis Date  . Arthritis   . Complication of anesthesia   . DVT (deep venous thrombosis) ([place])   . GERD (gastroesophageal reflux disease)   . Hyperlipidemia   . Hypertension   . May-Thurner syndrome   . Osteopenia   . Pelvic mass   . PONV (postoperative nausea and vomiting)   . SCC (squamous cell carcinoma)    skin    Past Surgical History:  Procedure Laterality Date  . AUGMENTATION MAMMAPLASTY    . BREAST BIOPSY    . BREAST ENHANCEMENT SURGERY    . BREAST SURGERY    . CATARACT EXTRACTION Bilateral 2016  . IVC FILTER INSERTION N/A 01/03/2020   Procedure: IVC FILTER INSERTION;  Surgeon:  [name], MD;  Location: [facility];  Service: Cardiovascular;  Laterality: N/A;  . IVC FILTER REMOVAL N/A 04/03/2020   Procedure: IVC FILTER REMOVAL;  Surgeon: [name], MD;  Location: [facility];  Service: Cardiovascular;  Laterality: N/A;  . IVC VENOGRAPHY N/A 01/03/2020   Procedure: IVC Venography;  Surgeon: [name], MD;  Location: [facility];  Service: Cardiovascular;  Laterality: N/A;  . LAPAROSCOPIC LYSIS OF ADHESIONS  01/06/2020   Procedure: ROBOTIC LAPAROSCOPIC LYSIS OF ADHESIONS;  Surgeon: [name], MD;  Location: [facility];  Service: Gynecology;;  . LEEP  2000  . ROBOTIC ASSISTED TOTAL HYSTERECTOMY WITH BILATERAL SALPINGO OOPHERECTOMY Bilateral 01/06/2020   Procedure: XI ROBOTIC ASSISTED TOTAL HYSTERECTOMY WITH BILATERAL SALPINGO OOPHORECTOMY  POSLAPAROTOMY;  Surgeon: [name], MD;  Location: [facility];  Service: Gynecology;  Laterality: Bilateral;    Family History  Problem Relation Age of Onset  . Cancer Mother        breast, dx in last 45s  . Hypertension Mother   . Diabetes Mother        pre diabetes  . Breast cancer Mother   . Hypertension Father   . Cancer Brother        brain  . Diabetes Brother   . Cancer Maternal Aunt        possible breast  . Cancer Daughter        thyroid  . Cancer Paternal Uncle        colon  .  Breast cancer Maternal Grandmother        dx in 51s    Social History   Socioeconomic History  . Marital status: Married    Spouse name: Not on file  . Number of children: Not on file  . Years of education: Not on file  . Highest education level: Not on file  Occupational History  . Occupation: retired  Tobacco Use  . Smoking status: Former Smoker    Quit date: 1979    Years since quitting: 43.1  . Smokeless tobacco: Never Used  Vaping Use  . Vaping Use: Never used  Substance and Sexual Activity  . Alcohol use: Yes    Comment: social  . Drug use:  Never  . Sexual activity: Not Currently  Other Topics Concern  . Not on file  Social History Narrative  . Not on file   Social Determinants of Health   Financial Resource Strain: Not on file  Food Insecurity: Not on file  Transportation Needs: Not on file  Physical Activity: Not on file  Stress: Not on file  Social Connections: Not on file    Current Medications:  Current Outpatient Medications:  .  acetaminophen (TYLENOL) 500 MG tablet, Take 500-1,000 mg by mouth every 8 (eight) hours as needed for moderate pain or headache. , Disp: , Rfl:  .  aspirin EC 81 MG tablet, Take 81 mg by mouth daily. Swallow whole., Disp: , Rfl:  .  atorvastatin (LIPITOR) 20 MG tablet, Take 20 mg by mouth every evening. , Disp: , Rfl:  .  doxycycline (DORYX) 100 MG EC tablet, Take 100 mg by mouth 2 (two) times daily as needed (rosacea flare ups). , Disp: , Rfl:  .  lisinopril (ZESTRIL) 20 MG tablet, Take 20 mg by mouth daily., Disp: , Rfl:  .  Multiple Vitamins-Minerals (PRESERVISION AREDS 2 PO), Take 1 tablet by mouth in the morning and at bedtime., Disp: , Rfl:  .  polyethylene glycol (MIRALAX / GLYCOLAX) 17 g packet, Take 17 g by mouth daily as needed (constipation.)., Disp: , Rfl:  .  RESTASIS 0.05 % ophthalmic emulsion, Place 1 drop into both eyes 2 (two) times daily as needed (dry/irritated eyes.). , Disp: , Rfl:  .  ALPRAZolam (XANAX) 0.5 MG tablet, , Disp: , Rfl:   Review of Systems: Denies appetite changes, fevers, chills, fatigue, unexplained weight changes. Denies hearing loss, neck lumps or masses, mouth sores, ringing in ears or voice changes. Denies cough or wheezing.  Denies shortness of breath. Denies chest pain or palpitations. Denies leg swelling. Denies abdominal distention, pain, blood in stools, constipation, diarrhea, nausea, vomiting, or early satiety. Denies pain with intercourse, dysuria, frequency, hematuria or incontinence. Denies hot flashes, pelvic pain, vaginal bleeding  or vaginal discharge.   Denies joint pain, back pain or muscle pain/cramps. Denies itching, rash, or wounds. Denies dizziness, headaches, numbness or seizures. Denies swollen lymph nodes or glands, denies easy bruising or bleeding. Denies anxiety, depression, confusion, or decreased concentration.  Physical Exam: BP (!) 129/52 (BP Location: Left Arm, Patient Position: Sitting)   Pulse 68   Temp (!) 97 F (36.1 C) (Tympanic)   Resp 18   Wt 205 lb (93 kg)   SpO2 100%   BMI 32.11 kg/m  General: Alert, oriented, no acute distress. HEENT: Normocephalic, atraumatic, sclera anicteric. Chest: Unlabored breathing on room air. Abdomen: soft, nontender.  Normoactive bowel sounds.  No masses or hepatosplenomegaly appreciated.  Well-healed incisions. Extremities: Grossly normal range of motion.  Warm, well perfused.  Trace to 1+ edema bilaterally.  Varicose veins noted bilateral lower extremities. Skin: No rashes or lesions noted. Lymphatics: No cervical, supraclavicular, or inguinal adenopathy. GU: Normal appearing external genitalia without erythema, excoriation, or lesions.  Speculum exam reveals no vaginal lesions, bleeding, or discharge.  Bimanual exam reveals cuff intact, no nodularity or masses.  Rectovaginal exam confirms these findings.  Laboratory & Radiologic Studies: None new  CA-125 from today is pending  Assessment & Plan: Bethany Simon is a 70 y.o. 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  Patient is doing very well and is NED on exam today.  A CA-125 was drawn and pending.  I will release this to her on my chart when results tomorrow.  We discussed [facility] surveillance recommendations that include of surveillance visit and exam every 3-6 months for 5 years.  In the setting of completion surgery in early stage disease, I think it would be appropriate to space out the surveillance visits.  SGO surveillance recommendation would be for  every year.    We have previously discussed 6 months versus 1 year surveillance with CA-125.  We will continue with visits every 6 months and reevaluate at future visits spacing out to yearly visits.  While the risk of recurrence is decreased with completion surgery, recurrence can an can be quite late.  We reviewed signs and symptoms that would be concerning for recurrence and she knows to call the clinic sooner than that if she develops any.  The patient will call back over the summer to schedule a visit to see me in August or September.  28 minutes of total time was spent for this patient encounter, including preparation, face-to-face counseling with the patient and coordination of care, and documentation of the encounter.  Jeral Pinch, MD  Division of Gynecologic Oncology  Department of Obstetrics and Gynecology  [facility]

## 2020-08-08 ENCOUNTER — Inpatient Hospital Stay: Payer: Medicare PPO | Attending: Gynecologic Oncology

## 2020-08-08 ENCOUNTER — Encounter: Payer: Self-pay | Admitting: Gynecologic Oncology

## 2020-08-08 ENCOUNTER — Inpatient Hospital Stay: Payer: Medicare PPO | Admitting: Gynecologic Oncology

## 2020-08-08 ENCOUNTER — Other Ambulatory Visit: Payer: Self-pay

## 2020-08-08 VITALS — BP 129/52 | HR 68 | Temp 97.0°F | Resp 18 | Wt 205.0 lb

## 2020-08-08 DIAGNOSIS — Z90722 Acquired absence of ovaries, bilateral: Secondary | ICD-10-CM | POA: Insufficient documentation

## 2020-08-08 DIAGNOSIS — Z86718 Personal history of other venous thrombosis and embolism: Secondary | ICD-10-CM | POA: Diagnosis not present

## 2020-08-08 DIAGNOSIS — M8580 Other specified disorders of bone density and structure, unspecified site: Secondary | ICD-10-CM | POA: Diagnosis not present

## 2020-08-08 DIAGNOSIS — D3910 Neoplasm of uncertain behavior of unspecified ovary: Secondary | ICD-10-CM

## 2020-08-08 DIAGNOSIS — I10 Essential (primary) hypertension: Secondary | ICD-10-CM | POA: Insufficient documentation

## 2020-08-08 DIAGNOSIS — D3912 Neoplasm of uncertain behavior of left ovary: Secondary | ICD-10-CM | POA: Insufficient documentation

## 2020-08-08 DIAGNOSIS — Z90710 Acquired absence of both cervix and uterus: Secondary | ICD-10-CM | POA: Diagnosis not present

## 2020-08-08 DIAGNOSIS — Z79899 Other long term (current) drug therapy: Secondary | ICD-10-CM | POA: Insufficient documentation

## 2020-08-08 DIAGNOSIS — D3911 Neoplasm of uncertain behavior of right ovary: Secondary | ICD-10-CM

## 2020-08-08 DIAGNOSIS — Z87891 Personal history of nicotine dependence: Secondary | ICD-10-CM | POA: Insufficient documentation

## 2020-08-08 DIAGNOSIS — Z7982 Long term (current) use of aspirin: Secondary | ICD-10-CM | POA: Diagnosis not present

## 2020-08-08 DIAGNOSIS — E785 Hyperlipidemia, unspecified: Secondary | ICD-10-CM | POA: Insufficient documentation

## 2020-08-08 DIAGNOSIS — K219 Gastro-esophageal reflux disease without esophagitis: Secondary | ICD-10-CM | POA: Insufficient documentation

## 2020-08-08 NOTE — Patient Instructions (Addendum)
Great to see you! No evidence of recurrent disease. I will release your CA-125 in mychart once back tomorrow.  Please call over the summer to schedule a 6 months follow-up with me. If you develop any new symptoms before then, please call the clinic to see me sooner.  All the best to you and your daughter!

## 2020-08-09 LAB — CA 125: Cancer Antigen (CA) 125: 8.3 U/mL (ref 0.0–38.1)

## 2020-08-21 ENCOUNTER — Ambulatory Visit
Admission: RE | Admit: 2020-08-21 | Discharge: 2020-08-21 | Disposition: A | Discharge status: Home / Self Care | Payer: Medicare PPO | Source: Ambulatory Visit | Attending: Family Medicine | Admitting: Family Medicine

## 2020-08-21 ENCOUNTER — Other Ambulatory Visit: Payer: Self-pay | Admitting: Family Medicine

## 2020-08-21 ENCOUNTER — Other Ambulatory Visit: Payer: Self-pay

## 2020-08-21 DIAGNOSIS — M79604 Pain in right leg: Secondary | ICD-10-CM

## 2020-08-21 DIAGNOSIS — Z86718 Personal history of other venous thrombosis and embolism: Secondary | ICD-10-CM | POA: Diagnosis not present

## 2020-08-21 DIAGNOSIS — I10 Essential (primary) hypertension: Secondary | ICD-10-CM | POA: Diagnosis not present

## 2020-08-21 DIAGNOSIS — M79661 Pain in right lower leg: Secondary | ICD-10-CM | POA: Diagnosis not present

## 2020-08-21 DIAGNOSIS — R252 Cramp and spasm: Secondary | ICD-10-CM | POA: Diagnosis not present

## 2020-10-25 DIAGNOSIS — I700 Atherosclerosis of aorta: Secondary | ICD-10-CM | POA: Diagnosis not present

## 2020-10-25 DIAGNOSIS — F419 Anxiety disorder, unspecified: Secondary | ICD-10-CM | POA: Diagnosis not present

## 2020-10-25 DIAGNOSIS — I10 Essential (primary) hypertension: Secondary | ICD-10-CM | POA: Diagnosis not present

## 2020-10-25 DIAGNOSIS — E785 Hyperlipidemia, unspecified: Secondary | ICD-10-CM | POA: Diagnosis not present

## 2020-11-17 ENCOUNTER — Telehealth: Payer: Self-pay

## 2020-11-17 ENCOUNTER — Other Ambulatory Visit: Payer: Self-pay

## 2020-11-17 DIAGNOSIS — D3910 Neoplasm of uncertain behavior of unspecified ovary: Secondary | ICD-10-CM

## 2020-11-17 NOTE — Telephone Encounter (Signed)
Spoke with Bethany Simon today and setup her appointment with Dr. Berline Lopes for 01/30/21 at 1 pm. She has a lab appointment prior to that at 12:30 for a CA125. Patient in agreement and verbalized understanding.

## 2021-01-27 DIAGNOSIS — R519 Headache, unspecified: Secondary | ICD-10-CM | POA: Diagnosis not present

## 2021-01-27 DIAGNOSIS — R5383 Other fatigue: Secondary | ICD-10-CM | POA: Diagnosis not present

## 2021-01-27 DIAGNOSIS — U071 COVID-19: Secondary | ICD-10-CM | POA: Diagnosis not present

## 2021-01-27 DIAGNOSIS — R051 Acute cough: Secondary | ICD-10-CM | POA: Diagnosis not present

## 2021-01-29 ENCOUNTER — Other Ambulatory Visit: Payer: Self-pay | Admitting: Gynecologic Oncology

## 2021-01-29 DIAGNOSIS — D3910 Neoplasm of uncertain behavior of unspecified ovary: Secondary | ICD-10-CM

## 2021-01-29 NOTE — Progress Notes (Signed)
CA 125 ordered.

## 2021-01-30 ENCOUNTER — Inpatient Hospital Stay: Payer: Medicare PPO | Admitting: Gynecologic Oncology

## 2021-01-30 ENCOUNTER — Inpatient Hospital Stay: Payer: Medicare PPO | Attending: Gynecologic Oncology

## 2021-01-30 DIAGNOSIS — D3910 Neoplasm of uncertain behavior of unspecified ovary: Secondary | ICD-10-CM | POA: Insufficient documentation

## 2021-02-20 ENCOUNTER — Other Ambulatory Visit: Payer: Self-pay

## 2021-02-20 ENCOUNTER — Inpatient Hospital Stay: Payer: Medicare PPO

## 2021-02-20 DIAGNOSIS — D3910 Neoplasm of uncertain behavior of unspecified ovary: Secondary | ICD-10-CM | POA: Diagnosis not present

## 2021-02-21 ENCOUNTER — Telehealth: Payer: Self-pay | Admitting: *Deleted

## 2021-02-21 LAB — CA 125: Cancer Antigen (CA) 125: 10 U/mL (ref 0.0–38.1)

## 2021-02-21 NOTE — Telephone Encounter (Signed)
Attempted to reach the patient to cancel appt for 9/2, left a message for the patient to call the office back

## 2021-02-21 NOTE — Telephone Encounter (Signed)
Patent called the office back and appt for 9/2 cancel, explained that the office will call back to reschedule next week

## 2021-02-23 ENCOUNTER — Telehealth: Payer: Self-pay

## 2021-02-23 ENCOUNTER — Ambulatory Visit: Payer: Medicare PPO | Admitting: Gynecologic Oncology

## 2021-02-23 IMAGING — MG DIGITAL SCREENING BREAST BILAT IMPLANT W/ TOMO W/ CAD
9 of 14 series · 9 of 34 positions shown · non-contrast
Comparison: Previous exam(s).

CLINICAL DATA: Screening.

EXAM:
DIGITAL SCREENING BILATERAL MAMMOGRAM WITH IMPLANTS, CAD AND TOMO
The patient has prepectoral implants. Standard and implant displaced
views were performed.

[L MLO]
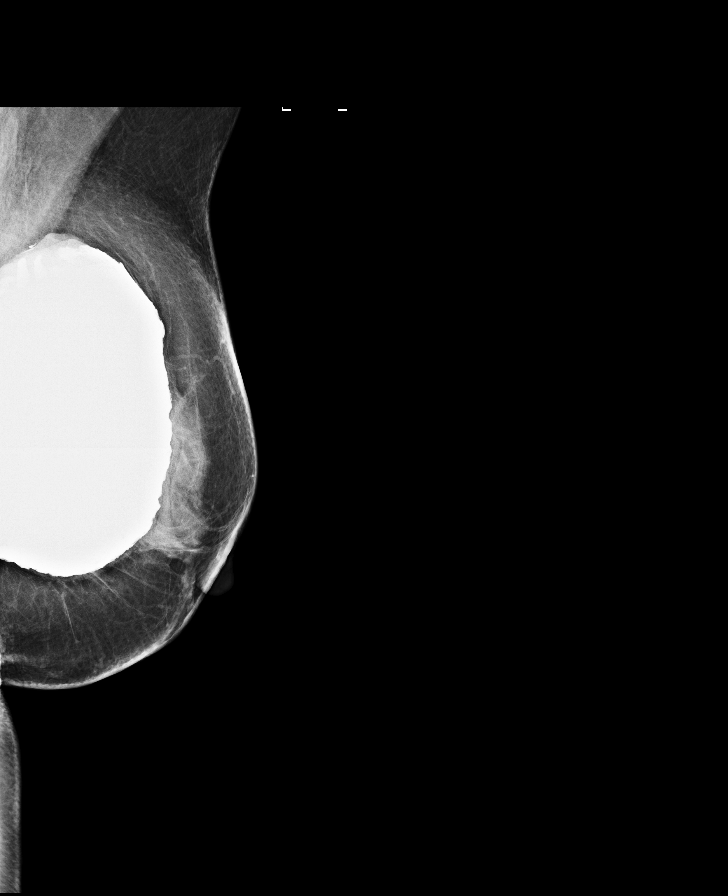

[R MLO]
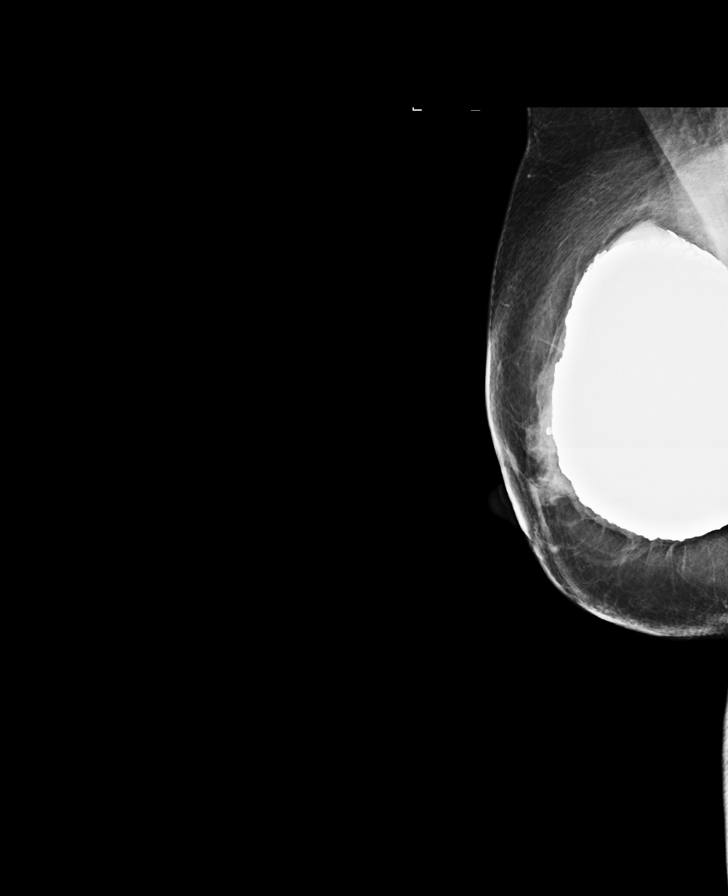

[L CC]
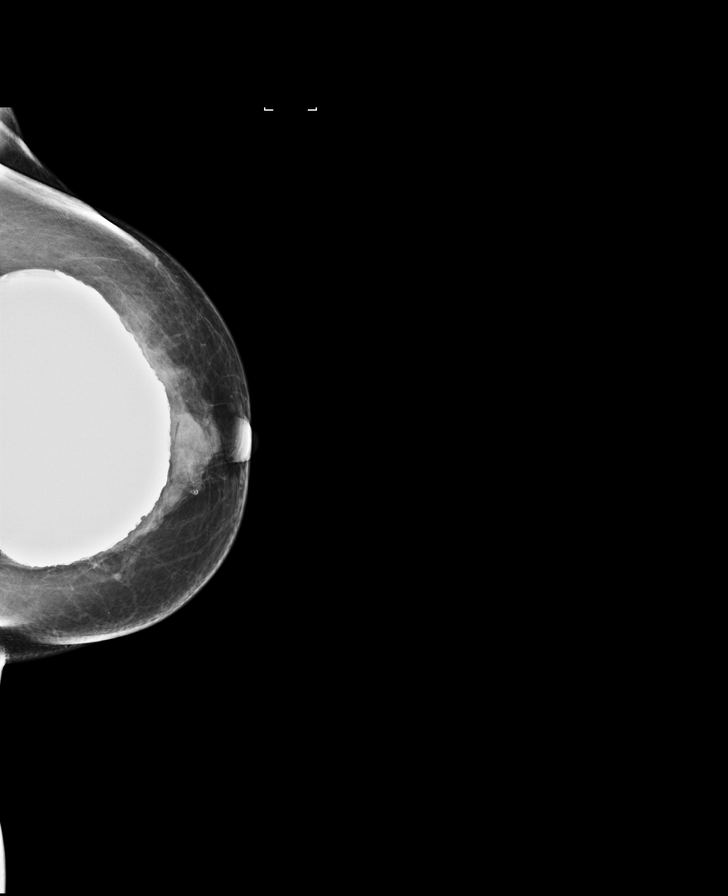

[R CC]
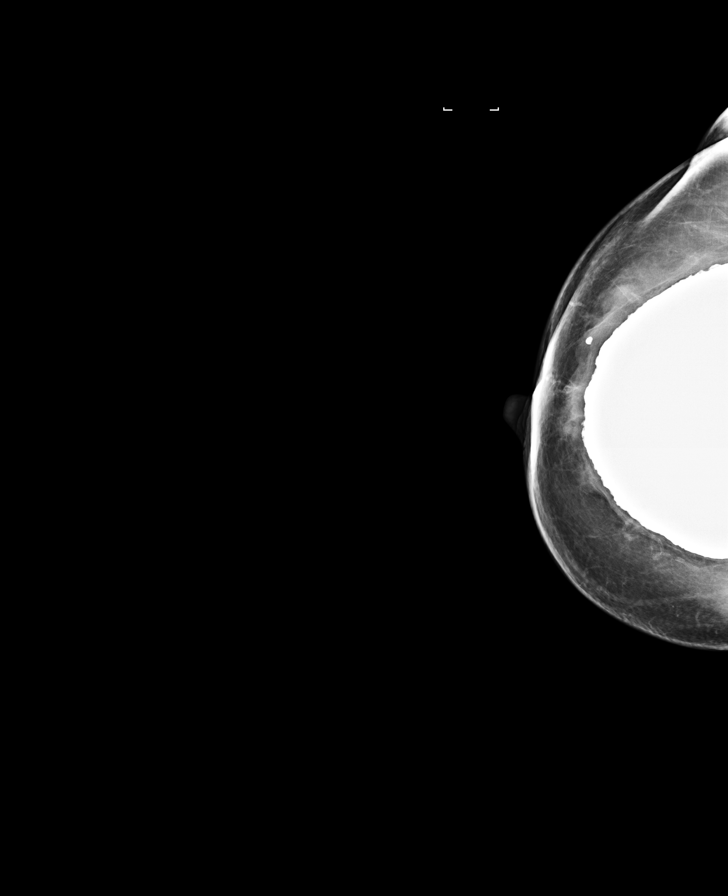

[R MLO synth-2D (1 of 2)]
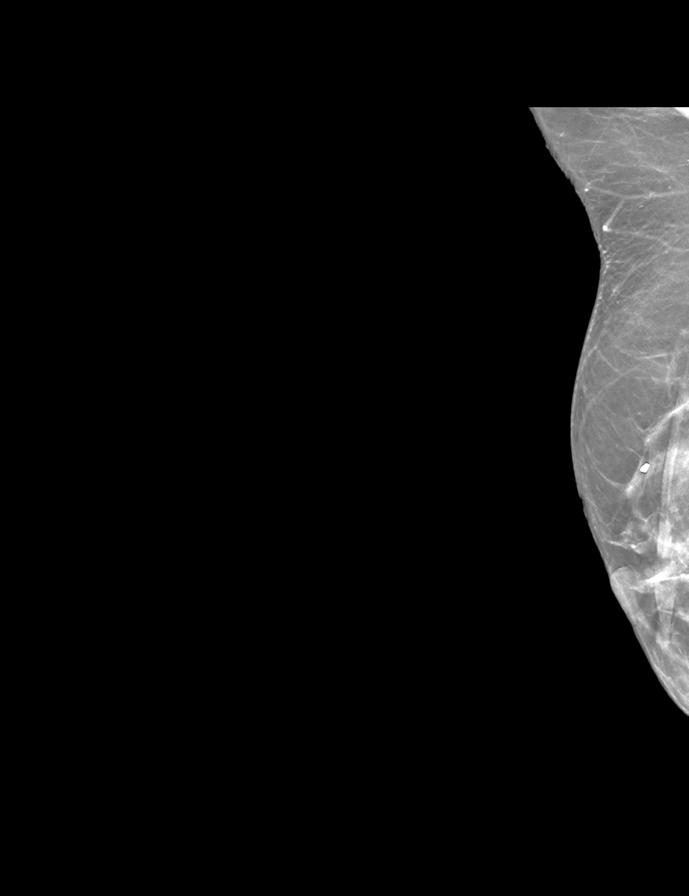

[L CC synth-2D]
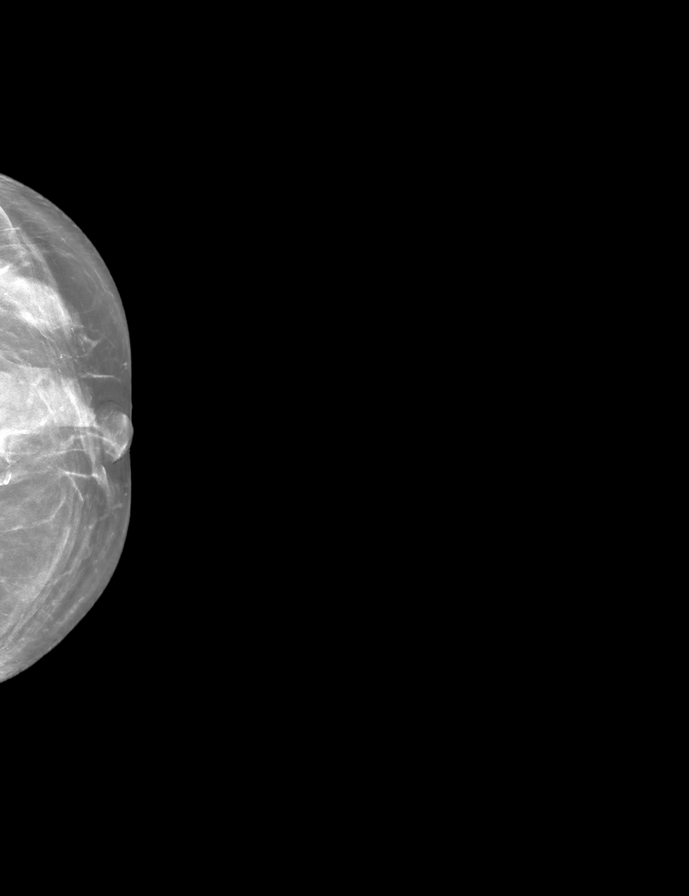

[R MLO synth-2D (2 of 2)]
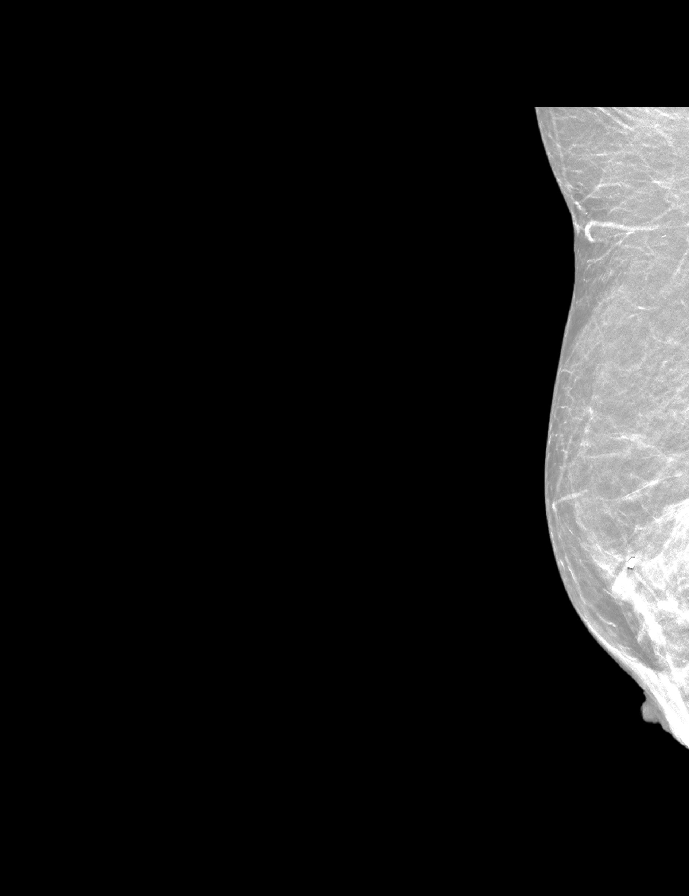

[R CC synth-2D]
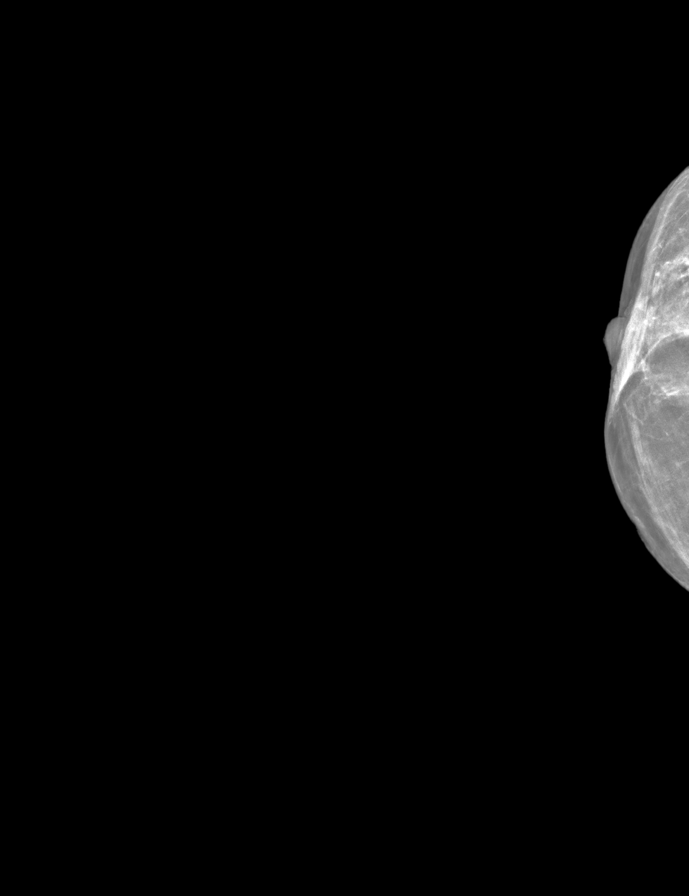

[L MLO synth-2D]
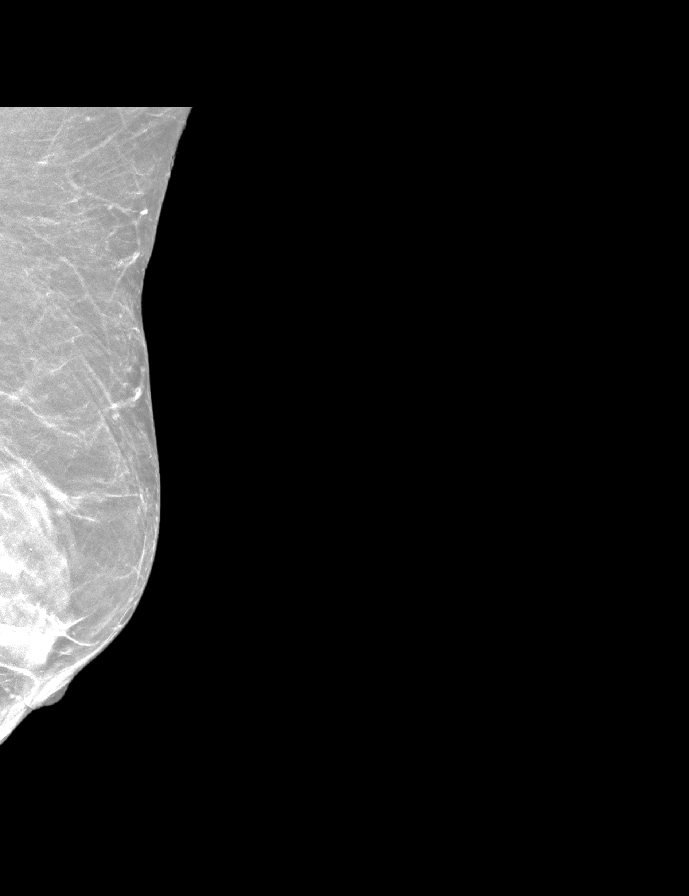

[9 of 34 positions shown; findings below may reference images not displayed]

ACR Breast Density Category c: The breast tissue is heterogeneously
dense, which may obscure small masses.
FINDINGS: There are no findings suspicious for malignancy. Images were
processed with CAD.
IMPRESSION: No mammographic evidence of malignancy. A result letter of this
screening mammogram will be mailed directly to the patient.

RECOMMENDATION:
Screening mammogram in one year. (Code:TS-8-EXC)

BI-RADS CATEGORY  1:  Negative.

## 2021-02-23 NOTE — Telephone Encounter (Signed)
Told Ms Rashed that her CA-125 was WNL and stable at 10.0. Pt inquired about when her appointment with Dr. Berline Lopes was going to be r/s as it was cancelled for 02-23-21 due to physician illness. Scheduled pt on 03-09-21 as [place].

## 2021-02-23 NOTE — Telephone Encounter (Signed)
Encounter opened in error

## 2021-03-09 ENCOUNTER — Inpatient Hospital Stay: Payer: Medicare PPO | Attending: Gynecologic Oncology | Admitting: Gynecologic Oncology

## 2021-03-09 ENCOUNTER — Encounter: Payer: Self-pay | Admitting: Gynecologic Oncology

## 2021-03-09 ENCOUNTER — Other Ambulatory Visit: Payer: Self-pay

## 2021-03-09 VITALS — BP 120/60 | HR 77 | Temp 97.9°F | Resp 18 | Ht 67.0 in | Wt 211.8 lb

## 2021-03-09 DIAGNOSIS — D3910 Neoplasm of uncertain behavior of unspecified ovary: Secondary | ICD-10-CM | POA: Diagnosis not present

## 2021-03-09 NOTE — Progress Notes (Signed)
Gynecologic Oncology Return Clinic Visit  03/09/2021  Reason for Visit: Surveillance visit in the setting of a borderline ovarian tumor  Treatment History: CT scan on 6/19: Heterogeneous mass within the pelvis with areas of cystic or necrotic changes concerning for malignancy. CA-125 was elevated before surgery at 124. CEA was normal. 7/12: Patient underwent IVC filter placement given recent history of DVT in anticipation of surgery. 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 04/03/20: removal of IVC filter  Interval History: Presents today for follow-up.  Overall is doing very well.  Denies any abdominal or pelvic pain.  Denies any vaginal bleeding or discharge.  Reports regular bowel and bladder function.  Endorses a good appetite, denies any nausea or emesis.  Has gained back the weight that she initially lost.  Continues to have some pain behind her right knee that she describes as pressure that is uncomfortable when she squats or bends over.  She has previously undergone Doppler imaging was negative for DVT.  She denies any associated symptoms such as erythema or edema.  Past Medical/Surgical History: Past Medical History:  Diagnosis Date   Arthritis    Complication of anesthesia    DVT (deep venous thrombosis) (HCC)    GERD (gastroesophageal reflux disease)    Hyperlipidemia    Hypertension    May-Thurner syndrome    Osteopenia    Pelvic mass    PONV (postoperative nausea and vomiting)    SCC (squamous cell carcinoma)    skin    Past Surgical History:  Procedure Laterality Date   AUGMENTATION MAMMAPLASTY     BREAST BIOPSY     BREAST ENHANCEMENT SURGERY     BREAST SURGERY     CATARACT EXTRACTION Bilateral 2016   IVC FILTER INSERTION N/A 01/03/2020   Procedure: IVC FILTER INSERTION;   Surgeon: [name], MD;  Location: [facility];  Service: Cardiovascular;  Laterality: N/A;   IVC FILTER REMOVAL N/A 04/03/2020   Procedure: IVC FILTER REMOVAL;  Surgeon: [name], MD;  Location: [facility];  Service: Cardiovascular;  Laterality: N/A;   IVC VENOGRAPHY N/A 01/03/2020   Procedure: IVC Venography;  Surgeon: [name], MD;  Location: [facility];  Service: Cardiovascular;  Laterality: N/A;   LAPAROSCOPIC LYSIS OF ADHESIONS  01/06/2020   Procedure: ROBOTIC LAPAROSCOPIC LYSIS OF ADHESIONS;  Surgeon: [name], MD;  Location: [facility];  Service: Gynecology;;   LEEP  2000   ROBOTIC ASSISTED TOTAL HYSTERECTOMY WITH BILATERAL SALPINGO OOPHERECTOMY Bilateral 01/06/2020   Procedure: XI ROBOTIC ASSISTED TOTAL HYSTERECTOMY WITH BILATERAL SALPINGO OOPHORECTOMY  POSLAPAROTOMY;  Surgeon: [name], MD;  Location: [facility];  Service: Gynecology;  Laterality: Bilateral;    Family History  Problem Relation Age of Onset   Cancer Mother        breast, dx in last 88s   Hypertension Mother    Diabetes Mother        pre diabetes   Breast cancer Mother    Hypertension Father    Cancer Brother        brain   Diabetes Brother    Cancer Maternal Aunt        possible breast   Cancer Daughter        thyroid   Cancer Paternal Uncle  colon   Breast cancer Maternal Grandmother        dx in 12s    Social History   Socioeconomic History   Marital status: Married    Spouse name: Not on file   Number of children: Not on file   Years of education: Not on file   Highest education level: Not on file  Occupational History   Occupation: retired  Tobacco Use   Smoking status: Former    Types: Cigarettes    Quit date: 1979    Years since quitting: 43.7   Smokeless tobacco: Never  Vaping Use   Vaping Use: Never used  Substance and Sexual Activity   Alcohol use: Yes    Comment: social   Drug use: Never    Sexual activity: Not Currently  Other Topics Concern   Not on file  Social History Narrative   Not on file   Social Determinants of Health   Financial Resource Strain: Not on file  Food Insecurity: Not on file  Transportation Needs: Not on file  Physical Activity: Not on file  Stress: Not on file  Social Connections: Not on file    Current Medications:  Current Outpatient Medications:    acetaminophen (TYLENOL) 500 MG tablet, Take 500-1,000 mg by mouth every 8 (eight) hours as needed for moderate pain or headache. , Disp: , Rfl:    aspirin EC 81 MG tablet, Take 81 mg by mouth daily. Swallow whole., Disp: , Rfl:    atorvastatin (LIPITOR) 20 MG tablet, Take 20 mg by mouth every evening. , Disp: , Rfl:    doxycycline (DORYX) 100 MG EC tablet, Take 100 mg by mouth 2 (two) times daily as needed (rosacea flare ups). , Disp: , Rfl:    lisinopril (ZESTRIL) 20 MG tablet, Take 20 mg by mouth daily., Disp: , Rfl:    Multiple Vitamins-Minerals (PRESERVISION AREDS 2 PO), Take 1 tablet by mouth in the morning and at bedtime., Disp: , Rfl:    polyethylene glycol (MIRALAX / GLYCOLAX) 17 g packet, Take 17 g by mouth daily as needed (constipation.)., Disp: , Rfl:    RESTASIS 0.05 % ophthalmic emulsion, Place 1 drop into both eyes 2 (two) times daily as needed (dry/irritated eyes.). , Disp: , Rfl:    ALPRAZolam (XANAX) 0.5 MG tablet, , Disp: , Rfl:   Review of Systems: Denies appetite changes, fevers, chills, fatigue, unexplained weight changes. Denies hearing loss, neck lumps or masses, mouth sores, ringing in ears or voice changes. Denies cough or wheezing.  Denies shortness of breath. Denies chest pain or palpitations. Denies leg swelling. Denies abdominal distention, pain, blood in stools, constipation, diarrhea, nausea, vomiting, or early satiety. Denies pain with intercourse, dysuria, frequency, hematuria or incontinence. Denies hot flashes, pelvic pain, vaginal bleeding or vaginal  discharge.   Denies back pain or muscle pain/cramps. Denies itching, rash, or wounds. Denies dizziness, headaches, numbness or seizures. Denies swollen lymph nodes or glands, denies easy bruising or bleeding. Denies anxiety, depression, confusion, or decreased concentration.  Physical Exam: BP 120/60 (BP Location: Right Arm)   Pulse 77   Temp 97.9 F (36.6 C) (Tympanic)   Resp 18   Ht '5\' 7"'$  (1.702 m)   Wt 211 lb 12.8 oz (96.1 kg)   SpO2 99%   BMI 33.17 kg/m  General: Alert, oriented, no acute distress. HEENT: Normocephalic, atraumatic, sclera anicteric. Chest: Clear to auscultation bilaterally.  No wheezing or rhonchi. Cardiovascular: Regular rate and rhythm, no murmurs. Abdomen: soft, nontender.  Normoactive bowel sounds.  No masses or hepatosplenomegaly appreciated.  Well-healed incisions. Extremities: Grossly normal range of motion.  Warm, well perfused.  No edema bilaterally. Skin: No rashes or lesions noted. GU: Normal appearing external genitalia without erythema, excoriation, or lesions.  Speculum exam reveals mildly atrophic vaginal mucosa, no lesions or masses.  No discharge or bleeding.  Bimanual exam reveals cuff intact, no nodularity or masses.  Rectovaginal exam confirms findings.  Laboratory & Radiologic Studies: CA-125 on 8/30: 10  Assessment & Plan: Bethany Simon is a 70 y.o. 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   Patient is doing very well and is NED on exam today.    With regard to her knee pain, I suggest that she reach out to her PCP regarding x-ray or other imaging.  There is been some discussion about whether there is could be arthritis or Baker's cyst.   We reviewed again today difference between NCCN surveillance recommendations that include of surveillance visit and exam every 3-6 months for 5 years.  In the setting of completion surgery in early stage disease, I think it would be appropriate to space  out the surveillance visits.  SGO surveillance recommendation would be for every year.    We will continue with visits every 6 months at this time.  We will plan to get a CA-125 at her next visit.  We reviewed signs and symptoms that should prompt a phone call to see me sooner.  32 minutes of total time was spent for this patient encounter, including preparation, face-to-face counseling with the patient and coordination of care, and documentation of the encounter.  Jeral Pinch, MD  Division of Gynecologic Oncology  Department of Obstetrics and Gynecology  [facility]

## 2021-03-09 NOTE — Patient Instructions (Signed)
Was great to see you today!  Your exam is normal.  We will plan for a visit in 6 months.  Please call the clinic back sometime in January to get scheduled to see me in March.  If anything changes (you develop any symptoms that we talked about today such as pelvic pain, change to bowel function, or unintentional weight loss), please call to see me sooner.

## 2021-05-10 DIAGNOSIS — E785 Hyperlipidemia, unspecified: Secondary | ICD-10-CM | POA: Diagnosis not present

## 2021-05-10 DIAGNOSIS — I10 Essential (primary) hypertension: Secondary | ICD-10-CM | POA: Diagnosis not present

## 2021-05-10 DIAGNOSIS — Z0000 Encounter for general adult medical examination without abnormal findings: Secondary | ICD-10-CM | POA: Diagnosis not present

## 2021-05-10 DIAGNOSIS — R252 Cramp and spasm: Secondary | ICD-10-CM | POA: Diagnosis not present

## 2021-05-10 DIAGNOSIS — H9313 Tinnitus, bilateral: Secondary | ICD-10-CM | POA: Diagnosis not present

## 2021-05-10 DIAGNOSIS — L719 Rosacea, unspecified: Secondary | ICD-10-CM | POA: Diagnosis not present

## 2021-05-10 DIAGNOSIS — F419 Anxiety disorder, unspecified: Secondary | ICD-10-CM | POA: Diagnosis not present

## 2021-05-10 DIAGNOSIS — M549 Dorsalgia, unspecified: Secondary | ICD-10-CM | POA: Diagnosis not present

## 2021-05-10 DIAGNOSIS — M8588 Other specified disorders of bone density and structure, other site: Secondary | ICD-10-CM | POA: Diagnosis not present

## 2021-05-21 ENCOUNTER — Other Ambulatory Visit: Payer: Self-pay | Admitting: Family Medicine

## 2021-05-21 DIAGNOSIS — M8580 Other specified disorders of bone density and structure, unspecified site: Secondary | ICD-10-CM

## 2021-05-31 ENCOUNTER — Other Ambulatory Visit: Payer: Self-pay | Admitting: Family Medicine

## 2021-05-31 DIAGNOSIS — Z1231 Encounter for screening mammogram for malignant neoplasm of breast: Secondary | ICD-10-CM

## 2021-07-06 ENCOUNTER — Ambulatory Visit
Admission: RE | Admit: 2021-07-06 | Discharge: 2021-07-06 | Disposition: A | Discharge status: Home / Self Care | Payer: Medicare PPO | Source: Ambulatory Visit | Attending: Family Medicine | Admitting: Family Medicine

## 2021-07-06 DIAGNOSIS — Z1231 Encounter for screening mammogram for malignant neoplasm of breast: Secondary | ICD-10-CM

## 2021-07-19 DIAGNOSIS — H26492 Other secondary cataract, left eye: Secondary | ICD-10-CM | POA: Diagnosis not present

## 2021-07-19 DIAGNOSIS — Z961 Presence of intraocular lens: Secondary | ICD-10-CM | POA: Diagnosis not present

## 2021-07-19 DIAGNOSIS — H524 Presbyopia: Secondary | ICD-10-CM | POA: Diagnosis not present

## 2021-07-19 DIAGNOSIS — H52203 Unspecified astigmatism, bilateral: Secondary | ICD-10-CM | POA: Diagnosis not present

## 2021-07-26 ENCOUNTER — Telehealth: Payer: Self-pay | Admitting: *Deleted

## 2021-07-26 ENCOUNTER — Other Ambulatory Visit: Payer: Medicare PPO

## 2021-07-26 NOTE — Telephone Encounter (Signed)
Returned the patient's call and scheduled the patient for a MD/lab appt with Dr Berline Lopes on 3/2

## 2021-08-22 ENCOUNTER — Other Ambulatory Visit: Payer: Self-pay | Admitting: Gynecologic Oncology

## 2021-08-22 DIAGNOSIS — D3910 Neoplasm of uncertain behavior of unspecified ovary: Secondary | ICD-10-CM

## 2021-08-23 ENCOUNTER — Encounter: Payer: Self-pay | Admitting: Gynecologic Oncology

## 2021-08-23 ENCOUNTER — Inpatient Hospital Stay: Payer: Medicare PPO | Attending: Gynecologic Oncology | Admitting: Gynecologic Oncology

## 2021-08-23 ENCOUNTER — Other Ambulatory Visit: Payer: Self-pay

## 2021-08-23 ENCOUNTER — Inpatient Hospital Stay: Payer: Medicare PPO

## 2021-08-23 VITALS — BP 127/48 | HR 74 | Temp 97.9°F | Resp 16 | Ht 66.14 in | Wt 205.0 lb

## 2021-08-23 DIAGNOSIS — D3912 Neoplasm of uncertain behavior of left ovary: Secondary | ICD-10-CM

## 2021-08-23 DIAGNOSIS — D3910 Neoplasm of uncertain behavior of unspecified ovary: Secondary | ICD-10-CM

## 2021-08-23 DIAGNOSIS — R971 Elevated cancer antigen 125 [CA 125]: Secondary | ICD-10-CM | POA: Insufficient documentation

## 2021-08-23 DIAGNOSIS — Z90710 Acquired absence of both cervix and uterus: Secondary | ICD-10-CM | POA: Diagnosis not present

## 2021-08-23 DIAGNOSIS — Z90722 Acquired absence of ovaries, bilateral: Secondary | ICD-10-CM | POA: Diagnosis not present

## 2021-08-23 NOTE — Patient Instructions (Signed)
It was good to see you today.  I do not see or feel any evidence of recurrence of your borderline tumor.  Your CA-125 will result to you in MyChart once it is back tomorrow. ? ?We will plan on 1 additional visit in 6 months and then transition to yearly visits.  Because my schedule is not out past the end of June, please call back sometime in July to schedule a visit with me in early September.  As always, please call if you develop any concerning symptoms between visits. ?

## 2021-08-23 NOTE — Progress Notes (Signed)
Gynecologic Oncology Return Clinic Visit  08/23/21  Reason for Visit: surveillance in the setting of borderline ovarian tumor  Treatment History: CT scan on 6/19: Heterogeneous mass within the pelvis with areas of cystic or necrotic changes concerning for malignancy. CA-125 was elevated before surgery at 124. CEA was normal. 7/12: Patient underwent IVC filter placement given recent history of DVT in anticipation of surgery. 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 04/03/20: removal of IVC filter  Interval History: Patient reports doing well. Denies any abdominal or pelvic pain. Reports regular bowel and bladder function. Endorses good appetite, denies nausea or emesis.   Past Medical/Surgical History: Past Medical History:  Diagnosis Date   Arthritis    Complication of anesthesia    DVT (deep venous thrombosis) (HCC)    GERD (gastroesophageal reflux disease)    Hyperlipidemia    Hypertension    May-Thurner syndrome    Osteopenia    Pelvic mass    PONV (postoperative nausea and vomiting)    SCC (squamous cell carcinoma)    skin    Past Surgical History:  Procedure Laterality Date   AUGMENTATION MAMMAPLASTY     BREAST BIOPSY     BREAST ENHANCEMENT SURGERY     BREAST SURGERY     CATARACT EXTRACTION Bilateral 2016   IVC FILTER INSERTION N/A 01/03/2020   Procedure: IVC FILTER INSERTION;  Surgeon: [name], MD;  Location: [facility];  Service: Cardiovascular;  Laterality: N/A;   IVC FILTER REMOVAL N/A 04/03/2020   Procedure: IVC FILTER REMOVAL;  Surgeon: [name], MD;  Location: [facility];  Service: Cardiovascular;  Laterality: N/A;   IVC VENOGRAPHY N/A 01/03/2020   Procedure: IVC Venography;  Surgeon: [name], MD;   Location: [facility];  Service: Cardiovascular;  Laterality: N/A;   LAPAROSCOPIC LYSIS OF ADHESIONS  01/06/2020   Procedure: ROBOTIC LAPAROSCOPIC LYSIS OF ADHESIONS;  Surgeon: [name], MD;  Location: [facility];  Service: Gynecology;;   LEEP  2000   ROBOTIC ASSISTED TOTAL HYSTERECTOMY WITH BILATERAL SALPINGO OOPHERECTOMY Bilateral 01/06/2020   Procedure: XI ROBOTIC ASSISTED TOTAL HYSTERECTOMY WITH BILATERAL SALPINGO OOPHORECTOMY  POSLAPAROTOMY;  Surgeon: [name], MD;  Location: [facility];  Service: Gynecology;  Laterality: Bilateral;    Family History  Problem Relation Age of Onset   Cancer Mother        breast, dx in last 18s   Hypertension Mother    Diabetes Mother        pre diabetes   Breast cancer Mother    Hypertension Father    Cancer Brother        brain   Diabetes Brother    Cancer Maternal Aunt        possible breast   Cancer Daughter        thyroid   Cancer Paternal Uncle        colon   Breast cancer Maternal Grandmother        dx in 59s    Social History   Socioeconomic History   Marital status: Married    Spouse name: Not on file   Number of children: Not on file   Years of education: Not on file   Highest education level: Not on file  Occupational History   Occupation: retired  Tobacco Use  Smoking status: Former    Types: Cigarettes    Quit date: 1979    Years since quitting: 44.1   Smokeless tobacco: Never  Vaping Use   Vaping Use: Never used  Substance and Sexual Activity   Alcohol use: Yes    Comment: social   Drug use: Never   Sexual activity: Not Currently  Other Topics Concern   Not on file  Social History Narrative   Not on file   Social Determinants of Health   Financial Resource Strain: Not on file  Food Insecurity: Not on file  Transportation Needs: Not on file  Physical Activity: Not on file  Stress: Not on file  Social Connections: Not on file    Current Medications:  Current Outpatient  Medications:    acetaminophen (TYLENOL) 500 MG tablet, Take 500-1,000 mg by mouth every 8 (eight) hours as needed for moderate pain or headache. , Disp: , Rfl:    ALPRAZolam (XANAX) 0.5 MG tablet, , Disp: , Rfl:    aspirin EC 81 MG tablet, Take 81 mg by mouth daily. Swallow whole., Disp: , Rfl:    atorvastatin (LIPITOR) 20 MG tablet, Take 20 mg by mouth every evening. , Disp: , Rfl:    doxycycline (DORYX) 100 MG EC tablet, Take 100 mg by mouth 2 (two) times daily as needed (rosacea flare ups). , Disp: , Rfl:    lisinopril (ZESTRIL) 20 MG tablet, Take 20 mg by mouth daily., Disp: , Rfl:    Multiple Vitamins-Minerals (PRESERVISION AREDS 2 PO), Take 1 tablet by mouth in the morning and at bedtime., Disp: , Rfl:    polyethylene glycol (MIRALAX / GLYCOLAX) 17 g packet, Take 17 g by mouth daily as needed (constipation.)., Disp: , Rfl:    RESTASIS 0.05 % ophthalmic emulsion, Place 1 drop into both eyes 2 (two) times daily as needed (dry/irritated eyes.). , Disp: , Rfl:   Review of Systems: Denies appetite changes, fevers, chills, fatigue, unexplained weight changes. Denies hearing loss, neck lumps or masses, mouth sores, ringing in ears or voice changes. Denies cough or wheezing.  Denies shortness of breath. Denies chest pain or palpitations. Denies leg swelling. Denies abdominal distention, pain, blood in stools, constipation, diarrhea, nausea, vomiting, or early satiety. Denies pain with intercourse, dysuria, frequency, hematuria or incontinence. Denies hot flashes, pelvic pain, vaginal bleeding or vaginal discharge.   Denies joint pain, back pain or muscle pain/cramps. Denies itching, rash, or wounds. Denies dizziness, headaches, numbness or seizures. Denies swollen lymph nodes or glands, denies easy bruising or bleeding. Denies anxiety, depression, confusion, or decreased concentration.  Physical Exam: BP (!) 127/48 (BP Location: Left Arm, Patient Position: Sitting)    Pulse 74    Temp 97.9 F  (36.6 C) (Oral)    Resp 16    Ht 5' 6.14" (1.68 m)    Wt 205 lb (93 kg)    SpO2 99%    BMI 32.95 kg/m  General: Alert, oriented, no acute distress. HEENT: Normocephalic, atraumatic, sclera anicteric. Chest: Clear to auscultation bilaterally.  No wheezes or rhonchi. Cardiovascular: Regular rate and rhythm, no murmurs. Abdomen: soft, nontender.  Normoactive bowel sounds.  No masses or hepatosplenomegaly appreciated.  Well-healed incisions. Extremities: Grossly normal range of motion.  Warm, well perfused.  No edema bilaterally. Skin: No rashes or lesions noted. Lymphatics: No cervical, supraclavicular, or inguinal adenopathy. GU: Normal appearing external genitalia without erythema, excoriation, or lesions.  Speculum exam reveals vaginal mucosa without lesions, no masses noted.  Bimanual exam  reveals cuff intact, no masses or nodularity.  Rectovaginal exam  confirms these findings.  Laboratory & Radiologic Studies: Component Ref Range & Units 6 mo ago (02/20/21) 1 yr ago (08/08/20) 1 yr ago (12/24/19)  Cancer Antigen (CA) 125 0.0 - 38.1 U/mL 10.0  8.3 CM  124.0 High  CM     Assessment & Plan: KINZLEE SELVY is a 71 y.o. 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   Patient is doing very well and is NED on exam today.     We reviewed again today difference between NCCN surveillance recommendations that include of surveillance visit and exam every 3-6 months for 5 years.  In the setting of completion surgery in early stage disease, we had discussed surveillance visits every 6 months with consideration of transitioning to yearly visits at some time.  We will plan on 1 additional 22-month visit.  At that time, the patient will be more than 2 years out from diagnosis and we will transition to yearly visits.  CA-125, which was elevated prior to the time of her diagnosis, will be checked again today. We reviewed signs and symptoms that should prompt a phone  call to see me sooner.  28 minutes of total time was spent for this patient encounter, including preparation, face-to-face counseling with the patient and coordination of care, and documentation of the encounter.  Jeral Pinch, MD  Division of Gynecologic Oncology  Department of Obstetrics and Gynecology  [facility]

## 2021-08-24 LAB — CA 125: Cancer Antigen (CA) 125: 9.9 U/mL (ref 0.0–38.1)

## 2021-10-31 ENCOUNTER — Ambulatory Visit
Admission: RE | Admit: 2021-10-31 | Discharge: 2021-10-31 | Disposition: A | Discharge status: Home / Self Care | Payer: Medicare PPO | Source: Ambulatory Visit | Attending: Family Medicine | Admitting: Family Medicine

## 2021-10-31 DIAGNOSIS — Z780 Asymptomatic menopausal state: Secondary | ICD-10-CM | POA: Diagnosis not present

## 2021-10-31 DIAGNOSIS — M8589 Other specified disorders of bone density and structure, multiple sites: Secondary | ICD-10-CM | POA: Diagnosis not present

## 2021-10-31 DIAGNOSIS — M8580 Other specified disorders of bone density and structure, unspecified site: Secondary | ICD-10-CM

## 2021-11-07 DIAGNOSIS — I700 Atherosclerosis of aorta: Secondary | ICD-10-CM | POA: Diagnosis not present

## 2021-11-07 DIAGNOSIS — I10 Essential (primary) hypertension: Secondary | ICD-10-CM | POA: Diagnosis not present

## 2021-11-07 DIAGNOSIS — H9202 Otalgia, left ear: Secondary | ICD-10-CM | POA: Diagnosis not present

## 2021-11-07 DIAGNOSIS — F419 Anxiety disorder, unspecified: Secondary | ICD-10-CM | POA: Diagnosis not present

## 2021-11-07 DIAGNOSIS — E785 Hyperlipidemia, unspecified: Secondary | ICD-10-CM | POA: Diagnosis not present

## 2021-11-07 DIAGNOSIS — J3081 Allergic rhinitis due to animal (cat) (dog) hair and dander: Secondary | ICD-10-CM | POA: Diagnosis not present

## 2021-11-07 DIAGNOSIS — F4321 Adjustment disorder with depressed mood: Secondary | ICD-10-CM | POA: Diagnosis not present

## 2021-11-15 DIAGNOSIS — L570 Actinic keratosis: Secondary | ICD-10-CM | POA: Diagnosis not present

## 2021-12-04 ENCOUNTER — Other Ambulatory Visit: Payer: Self-pay

## 2021-12-04 DIAGNOSIS — D3910 Neoplasm of uncertain behavior of unspecified ovary: Secondary | ICD-10-CM

## 2022-01-24 ENCOUNTER — Telehealth: Payer: Self-pay

## 2022-01-24 NOTE — Telephone Encounter (Signed)
Error

## 2022-02-26 ENCOUNTER — Encounter: Payer: Self-pay | Admitting: Gynecologic Oncology

## 2022-02-26 ENCOUNTER — Inpatient Hospital Stay: Payer: Medicare PPO | Attending: Gynecologic Oncology

## 2022-02-26 ENCOUNTER — Other Ambulatory Visit: Payer: Self-pay

## 2022-02-26 DIAGNOSIS — Z90722 Acquired absence of ovaries, bilateral: Secondary | ICD-10-CM | POA: Diagnosis not present

## 2022-02-26 DIAGNOSIS — D3910 Neoplasm of uncertain behavior of unspecified ovary: Secondary | ICD-10-CM

## 2022-02-26 DIAGNOSIS — Z90710 Acquired absence of both cervix and uterus: Secondary | ICD-10-CM | POA: Diagnosis not present

## 2022-02-26 DIAGNOSIS — D3912 Neoplasm of uncertain behavior of left ovary: Secondary | ICD-10-CM | POA: Diagnosis not present

## 2022-02-27 LAB — CA 125: Cancer Antigen (CA) 125: 11.8 U/mL (ref 0.0–38.1)

## 2022-02-28 ENCOUNTER — Inpatient Hospital Stay (HOSPITAL_BASED_OUTPATIENT_CLINIC_OR_DEPARTMENT_OTHER): Payer: Medicare PPO | Admitting: Gynecologic Oncology

## 2022-02-28 ENCOUNTER — Encounter: Payer: Self-pay | Admitting: Gynecologic Oncology

## 2022-02-28 VITALS — BP 141/61 | HR 82 | Temp 98.7°F | Resp 16 | Ht 66.14 in | Wt 217.8 lb

## 2022-02-28 DIAGNOSIS — D3912 Neoplasm of uncertain behavior of left ovary: Secondary | ICD-10-CM

## 2022-02-28 DIAGNOSIS — Z90722 Acquired absence of ovaries, bilateral: Secondary | ICD-10-CM | POA: Diagnosis not present

## 2022-02-28 DIAGNOSIS — D3910 Neoplasm of uncertain behavior of unspecified ovary: Secondary | ICD-10-CM

## 2022-02-28 DIAGNOSIS — Z90710 Acquired absence of both cervix and uterus: Secondary | ICD-10-CM | POA: Diagnosis not present

## 2022-02-28 NOTE — Progress Notes (Signed)
Gynecologic Oncology Return Clinic Visit  02/28/22  Reason for Visit: surveillance in the setting of borderline ovarian tumor  Treatment History: CT scan on 6/19: Heterogeneous mass within the pelvis with areas of cystic or necrotic changes concerning for malignancy. CA-125 was elevated before surgery at 124. CEA was normal. 7/12: Patient underwent IVC filter placement given recent history of DVT in anticipation of surgery. 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 04/03/20: removal of IVC filter  Interval History: Patient reports overall doing well.  She denies any abdominal or pelvic pain.  She reports baseline bowel bladder function.  She denies any vaginal bleeding or discharge.  Past Medical/Surgical History: Past Medical History:  Diagnosis Date   Arthritis    Complication of anesthesia    DVT (deep venous thrombosis) (HCC)    GERD (gastroesophageal reflux disease)    Hyperlipidemia    Hypertension    May-Thurner syndrome    Osteopenia    Pelvic mass    PONV (postoperative nausea and vomiting)    SCC (squamous cell carcinoma)    skin    Past Surgical History:  Procedure Laterality Date   AUGMENTATION MAMMAPLASTY     BREAST BIOPSY     BREAST ENHANCEMENT SURGERY     BREAST SURGERY     CATARACT EXTRACTION Bilateral 2016   IVC FILTER INSERTION N/A 01/03/2020   Procedure: IVC FILTER INSERTION;  Surgeon: [name], MD;  Location: [facility];  Service: Cardiovascular;  Laterality: N/A;   IVC FILTER REMOVAL N/A 04/03/2020   Procedure: IVC FILTER REMOVAL;  Surgeon: [name], MD;  Location: [facility];  Service: Cardiovascular;  Laterality: N/A;   IVC VENOGRAPHY N/A 01/03/2020   Procedure: IVC Venography;  Surgeon: [name],  MD;  Location: [facility];  Service: Cardiovascular;  Laterality: N/A;   LAPAROSCOPIC LYSIS OF ADHESIONS  01/06/2020   Procedure: ROBOTIC LAPAROSCOPIC LYSIS OF ADHESIONS;  Surgeon: [name], MD;  Location: [facility];  Service: Gynecology;;   LEEP  2000   ROBOTIC ASSISTED TOTAL HYSTERECTOMY WITH BILATERAL SALPINGO OOPHERECTOMY Bilateral 01/06/2020   Procedure: XI ROBOTIC ASSISTED TOTAL HYSTERECTOMY WITH BILATERAL SALPINGO OOPHORECTOMY  POSLAPAROTOMY;  Surgeon: [name], MD;  Location: [facility];  Service: Gynecology;  Laterality: Bilateral;    Family History  Problem Relation Age of Onset   Cancer Mother        breast, dx in last 2s   Hypertension Mother    Diabetes Mother        pre diabetes   Breast cancer Mother    Hypertension Father    Cancer Brother        brain   Diabetes Brother    Cancer Maternal Aunt        possible breast   Cancer Daughter        thyroid   Cancer Paternal Uncle        colon   Breast cancer Maternal Grandmother        dx in 79s    Social History   Socioeconomic History   Marital status: Married    Spouse name: Not on file   Number of children: Not on file   Years of education: Not on file   Highest education level: Not on file  Occupational History   Occupation:  retired  Tobacco Use   Smoking status: Former    Types: Cigarettes    Quit date: 1979    Years since quitting: 44.7   Smokeless tobacco: Never  Vaping Use   Vaping Use: Never used  Substance and Sexual Activity   Alcohol use: Yes    Comment: social   Drug use: Never   Sexual activity: Yes  Other Topics Concern   Not on file  Social History Narrative   Not on file   Social Determinants of Health   Financial Resource Strain: Not on file  Food Insecurity: Not on file  Transportation Needs: Not on file  Physical Activity: Not on file  Stress: Not on file  Social Connections: Not on file    Current Medications:  Current Outpatient Medications:     acetaminophen (TYLENOL) 500 MG tablet, Take 500-1,000 mg by mouth every 8 (eight) hours as needed for moderate pain or headache. , Disp: , Rfl:    ALPRAZolam (XANAX) 0.5 MG tablet, , Disp: , Rfl:    aspirin EC 81 MG tablet, Take 81 mg by mouth daily. Swallow whole., Disp: , Rfl:    atorvastatin (LIPITOR) 20 MG tablet, Take 20 mg by mouth daily., Disp: , Rfl:    doxycycline (DORYX) 100 MG EC tablet, Take 100 mg by mouth 2 (two) times daily as needed (rosacea flare ups). , Disp: , Rfl:    lisinopril-hydrochlorothiazide (ZESTORETIC) 10-12.5 MG tablet, Take 1 tablet by mouth daily., Disp: , Rfl:    Multiple Vitamins-Minerals (PRESERVISION AREDS 2 PO), Take 1 tablet by mouth in the morning and at bedtime., Disp: , Rfl:    polyethylene glycol (MIRALAX / GLYCOLAX) 17 g packet, Take 17 g by mouth daily as needed (constipation.)., Disp: , Rfl:    RESTASIS 0.05 % ophthalmic emulsion, Place 1 drop into both eyes 2 (two) times daily as needed (dry/irritated eyes.). , Disp: , Rfl:   Review of Systems: Denies appetite changes, fevers, chills, fatigue, unexplained weight changes. Denies hearing loss, neck lumps or masses, mouth sores, ringing in ears or voice changes. Denies cough or wheezing.  Denies shortness of breath. Denies chest pain or palpitations. Denies leg swelling. Denies abdominal distention, pain, blood in stools, constipation, diarrhea, nausea, vomiting, or early satiety. Denies pain with intercourse, dysuria, frequency, hematuria or incontinence. Denies hot flashes, pelvic pain, vaginal bleeding or vaginal discharge.   Denies joint pain, back pain or muscle pain/cramps. Denies itching, rash, or wounds. Denies dizziness, headaches, numbness or seizures. Denies swollen lymph nodes or glands, denies easy bruising or bleeding. Denies anxiety, depression, confusion, or decreased concentration.  Physical Exam: BP (!) 141/61 (BP Location: Left Arm, Patient Position: Sitting)   Pulse 82   Temp  98.7 F (37.1 C) (Oral)   Resp 16   Ht 5' 6.14" (1.68 m)   Wt 217 lb 12.8 oz (98.8 kg)   SpO2 96%   BMI 35.00 kg/m  General: Alert, oriented, no acute distress. HEENT: Normocephalic, atraumatic, sclera anicteric. Chest: Clear to auscultation bilaterally.  No wheezes or rhonchi. Cardiovascular: Regular rate and rhythm, no murmurs. Abdomen: soft, nontender.  Normoactive bowel sounds.  No masses or hepatosplenomegaly appreciated.  Well-healed incisions. Extremities: Grossly normal range of motion.  Warm, well perfused.  No edema bilaterally. Skin: No rashes or lesions noted. Lymphatics: No cervical, supraclavicular, or inguinal adenopathy. GU: Normal appearing external genitalia without erythema, excoriation, or lesions.  Speculum exam reveals mildly atrophic vaginal mucosa, no lesions, bleeding, or discharge.  Bimanual exam  reveals cuff intact, no nodularity or masses.  Rectovaginal exam confirms these findings, no adnexal masses palpated.  Laboratory & Radiologic Studies: Component Ref Range & Units 2 d ago (02/26/22) 6 mo ago (08/23/21) 1 yr ago (02/20/21) 1 yr ago (08/08/20) 2 yr ago (12/24/19)  Cancer Antigen (CA) 125 0.0 - 38.1 U/mL 11.8  9.9 CM  10.0 CM  8.3 CM  124.0 High      Assessment & Plan: Bethany Simon is a 71 y.o. 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 Definitive surgery in 12/2019.    Patient is doing very well and remains NED on exam today.  She is now more than 2 years from her definitive surgery.   We reviewed again today difference between NCCN surveillance recommendations that include of surveillance visit and exam every 3-6 months for 5 years.  In the setting of completion surgery in early stage disease, we had discussed surveillance visits every 6 months with consideration of transitioning to yearly visits at some time.  Previously talked about transitioning to yearly visits at the 2-year mark.  She is continuing to  see her OB/GYN and has a visit scheduled in November.  I suggested that we plan to transition to visits every 6 months alternating between my office and her OB/GYN.  She will call to schedule a visit to see me 6 months after her November visit, and may, and I will see her yearly in May.  She will follow-up with her OB/GYN yearly in November.    CA-125, which was elevated prior to the time of her diagnosis, has been followed with each visit.  This tumor marker remains normal. We reviewed signs and symptoms that should prompt a phone call to see me sooner.  22 minutes of total time was spent for this patient encounter, including preparation, face-to-face counseling with the patient and coordination of care, and documentation of the encounter.  Jeral Pinch, MD  Division of Gynecologic Oncology  Department of Obstetrics and Gynecology  [facility]

## 2022-02-28 NOTE — Patient Instructions (Addendum)
It was good to see you today.  I do not see or feel any recurrence of borderline tumor on your exam.  We talked about transitioning to visits alternating every 6 months between my office and your OB/GYN.  Please mention this at your visit in November.  I will tentatively plan to see you 6 months after that in May.  Please call sometime after the new year to get that visit scheduled with me.  As always, please call if you develop new and concerning symptoms.  We reviewed what some of these are today including abdominal or pelvic pain, decreased appetite or unintentional weight loss, change to bowel function.

## 2022-05-27 DIAGNOSIS — Z86718 Personal history of other venous thrombosis and embolism: Secondary | ICD-10-CM | POA: Diagnosis not present

## 2022-05-27 DIAGNOSIS — I871 Compression of vein: Secondary | ICD-10-CM | POA: Diagnosis not present

## 2022-05-27 DIAGNOSIS — E785 Hyperlipidemia, unspecified: Secondary | ICD-10-CM | POA: Diagnosis not present

## 2022-05-27 DIAGNOSIS — Z0000 Encounter for general adult medical examination without abnormal findings: Secondary | ICD-10-CM | POA: Diagnosis not present

## 2022-05-27 DIAGNOSIS — F419 Anxiety disorder, unspecified: Secondary | ICD-10-CM | POA: Diagnosis not present

## 2022-05-27 DIAGNOSIS — G4700 Insomnia, unspecified: Secondary | ICD-10-CM | POA: Diagnosis not present

## 2022-05-27 DIAGNOSIS — M8588 Other specified disorders of bone density and structure, other site: Secondary | ICD-10-CM | POA: Diagnosis not present

## 2022-05-27 DIAGNOSIS — I700 Atherosclerosis of aorta: Secondary | ICD-10-CM | POA: Diagnosis not present

## 2022-05-27 DIAGNOSIS — I10 Essential (primary) hypertension: Secondary | ICD-10-CM | POA: Diagnosis not present

## 2022-07-30 DIAGNOSIS — N289 Disorder of kidney and ureter, unspecified: Secondary | ICD-10-CM | POA: Diagnosis not present

## 2022-08-02 DIAGNOSIS — L820 Inflamed seborrheic keratosis: Secondary | ICD-10-CM | POA: Diagnosis not present

## 2022-08-02 DIAGNOSIS — L814 Other melanin hyperpigmentation: Secondary | ICD-10-CM | POA: Diagnosis not present

## 2022-08-02 DIAGNOSIS — L570 Actinic keratosis: Secondary | ICD-10-CM | POA: Diagnosis not present

## 2022-08-02 DIAGNOSIS — D225 Melanocytic nevi of trunk: Secondary | ICD-10-CM | POA: Diagnosis not present

## 2022-08-02 DIAGNOSIS — D485 Neoplasm of uncertain behavior of skin: Secondary | ICD-10-CM | POA: Diagnosis not present

## 2022-08-02 DIAGNOSIS — L821 Other seborrheic keratosis: Secondary | ICD-10-CM | POA: Diagnosis not present

## 2022-08-28 DIAGNOSIS — I10 Essential (primary) hypertension: Secondary | ICD-10-CM | POA: Diagnosis not present

## 2022-08-28 DIAGNOSIS — N289 Disorder of kidney and ureter, unspecified: Secondary | ICD-10-CM | POA: Diagnosis not present

## 2022-11-14 DIAGNOSIS — M25559 Pain in unspecified hip: Secondary | ICD-10-CM | POA: Diagnosis not present

## 2022-11-25 DIAGNOSIS — M25571 Pain in right ankle and joints of right foot: Secondary | ICD-10-CM | POA: Diagnosis not present

## 2022-11-25 DIAGNOSIS — M25551 Pain in right hip: Secondary | ICD-10-CM | POA: Diagnosis not present

## 2022-12-03 DIAGNOSIS — M25571 Pain in right ankle and joints of right foot: Secondary | ICD-10-CM | POA: Diagnosis not present

## 2022-12-03 DIAGNOSIS — M25551 Pain in right hip: Secondary | ICD-10-CM | POA: Diagnosis not present

## 2022-12-05 DIAGNOSIS — M25551 Pain in right hip: Secondary | ICD-10-CM | POA: Diagnosis not present

## 2022-12-05 DIAGNOSIS — M25571 Pain in right ankle and joints of right foot: Secondary | ICD-10-CM | POA: Diagnosis not present

## 2022-12-10 DIAGNOSIS — M25571 Pain in right ankle and joints of right foot: Secondary | ICD-10-CM | POA: Diagnosis not present

## 2022-12-10 DIAGNOSIS — M25551 Pain in right hip: Secondary | ICD-10-CM | POA: Diagnosis not present

## 2022-12-17 DIAGNOSIS — M25571 Pain in right ankle and joints of right foot: Secondary | ICD-10-CM | POA: Diagnosis not present

## 2022-12-17 DIAGNOSIS — M25551 Pain in right hip: Secondary | ICD-10-CM | POA: Diagnosis not present

## 2022-12-19 DIAGNOSIS — M25551 Pain in right hip: Secondary | ICD-10-CM | POA: Diagnosis not present

## 2022-12-19 DIAGNOSIS — M25571 Pain in right ankle and joints of right foot: Secondary | ICD-10-CM | POA: Diagnosis not present

## 2022-12-27 DIAGNOSIS — R971 Elevated cancer antigen 125 [CA 125]: Secondary | ICD-10-CM | POA: Diagnosis not present

## 2022-12-27 DIAGNOSIS — E785 Hyperlipidemia, unspecified: Secondary | ICD-10-CM | POA: Diagnosis not present

## 2022-12-27 DIAGNOSIS — Z87448 Personal history of other diseases of urinary system: Secondary | ICD-10-CM | POA: Diagnosis not present

## 2022-12-27 DIAGNOSIS — I700 Atherosclerosis of aorta: Secondary | ICD-10-CM | POA: Diagnosis not present

## 2022-12-27 DIAGNOSIS — Z8603 Personal history of neoplasm of uncertain behavior: Secondary | ICD-10-CM | POA: Diagnosis not present

## 2022-12-27 DIAGNOSIS — M25559 Pain in unspecified hip: Secondary | ICD-10-CM | POA: Diagnosis not present

## 2022-12-27 DIAGNOSIS — I10 Essential (primary) hypertension: Secondary | ICD-10-CM | POA: Diagnosis not present

## 2023-01-01 DIAGNOSIS — Z961 Presence of intraocular lens: Secondary | ICD-10-CM | POA: Diagnosis not present

## 2023-01-01 DIAGNOSIS — H04123 Dry eye syndrome of bilateral lacrimal glands: Secondary | ICD-10-CM | POA: Diagnosis not present

## 2023-01-01 DIAGNOSIS — H52202 Unspecified astigmatism, left eye: Secondary | ICD-10-CM | POA: Diagnosis not present

## 2023-01-01 DIAGNOSIS — H26493 Other secondary cataract, bilateral: Secondary | ICD-10-CM | POA: Diagnosis not present

## 2023-04-17 ENCOUNTER — Telehealth: Payer: Self-pay | Admitting: *Deleted

## 2023-04-17 NOTE — Telephone Encounter (Signed)
Returned a call to Ms. Bonfanti in order to schedule a yearly f/u appt. With Dr. Pricilla Holm. Left message for her to call back to get the appt. scheduled

## 2023-04-18 NOTE — Telephone Encounter (Signed)
Called and [facility] for the patient to call the office back to confirm appt. Left appts for 1/3 at 2:15 pm, for lab and 2:45 pm for MD visit

## 2023-06-27 ENCOUNTER — Encounter: Payer: Self-pay | Admitting: Gynecologic Oncology

## 2023-06-27 ENCOUNTER — Inpatient Hospital Stay: Payer: Medicare PPO

## 2023-06-27 ENCOUNTER — Inpatient Hospital Stay: Payer: Medicare PPO | Attending: Gynecologic Oncology | Admitting: Gynecologic Oncology

## 2023-06-27 VITALS — BP 136/62 | HR 67 | Temp 98.5°F | Resp 20 | Wt 214.0 lb

## 2023-06-27 DIAGNOSIS — D3910 Neoplasm of uncertain behavior of unspecified ovary: Secondary | ICD-10-CM

## 2023-06-27 DIAGNOSIS — R971 Elevated cancer antigen 125 [CA 125]: Secondary | ICD-10-CM | POA: Diagnosis not present

## 2023-06-27 DIAGNOSIS — Z86018 Personal history of other benign neoplasm: Secondary | ICD-10-CM | POA: Insufficient documentation

## 2023-06-27 DIAGNOSIS — Z09 Encounter for follow-up examination after completed treatment for conditions other than malignant neoplasm: Secondary | ICD-10-CM

## 2023-06-27 NOTE — Progress Notes (Signed)
 Gynecologic Oncology Return Clinic Visit  06/27/23  Reason for Visit: surveillance in the setting of borderline ovarian tumor   Treatment History: CT scan on 6/19: Heterogeneous mass within the pelvis with areas of cystic or necrotic changes concerning for malignancy. CA-125 was elevated before surgery at 124. CEA was normal. 7/12: Patient underwent IVC filter placement given recent history of DVT in anticipation of surgery. 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 04/03/20: removal of IVC filter   Interval History: Reports overall doing very well.  Continues to have tinnitus.  Denies any abdominal or pelvic pain.  Reports baseline bowel bladder function.  Denies any vaginal bleeding.  Past Medical/Surgical History: Past Medical History:  Diagnosis Date   Arthritis    Complication of anesthesia    DVT (deep venous thrombosis) (HCC)    GERD (gastroesophageal reflux disease)    Hyperlipidemia    Hypertension    May-Thurner syndrome    Osteopenia    Pelvic mass    PONV (postoperative nausea and vomiting)    SCC (squamous cell carcinoma)    skin    Past Surgical History:  Procedure Laterality Date   AUGMENTATION MAMMAPLASTY     BREAST BIOPSY     BREAST ENHANCEMENT SURGERY     BREAST SURGERY     CATARACT EXTRACTION Bilateral 2016   IVC FILTER INSERTION N/A 01/03/2020   Procedure: IVC FILTER INSERTION;  Surgeon: Sheree Penne Bruckner, MD;  Location: [facility];  Service: Cardiovascular;  Laterality: N/A;   IVC FILTER REMOVAL N/A 04/03/2020   Procedure: IVC FILTER REMOVAL;  Surgeon: Sheree Penne Bruckner, MD;  Location: [facility];  Service: Cardiovascular;  Laterality: N/A;   IVC VENOGRAPHY N/A 01/03/2020   Procedure: IVC Venography;  Surgeon: Sheree Penne Bruckner, MD;  Location: [facility];  Service: Cardiovascular;  Laterality: N/A;   LAPAROSCOPIC LYSIS OF ADHESIONS  01/06/2020   Procedure: ROBOTIC LAPAROSCOPIC LYSIS OF ADHESIONS;  Surgeon: Viktoria Comer SAUNDERS, MD;  Location: [facility];  Service: Gynecology;;   LEEP  2000   ROBOTIC ASSISTED TOTAL HYSTERECTOMY WITH BILATERAL SALPINGO OOPHERECTOMY Bilateral 01/06/2020   Procedure: XI ROBOTIC ASSISTED TOTAL HYSTERECTOMY WITH BILATERAL SALPINGO OOPHORECTOMY  POSLAPAROTOMY;  Surgeon: Viktoria Comer SAUNDERS, MD;  Location: [facility];  Service: Gynecology;  Laterality: Bilateral;    Family History  Problem Relation Age of Onset   Cancer Mother        breast, dx in last 66s   Hypertension Mother    Diabetes Mother        pre diabetes   Breast cancer Mother    Hypertension Father    Cancer Brother        brain   Diabetes Brother    Cancer Maternal Aunt        possible breast   Cancer Daughter        thyroid   Cancer Paternal Uncle        colon   Breast cancer Maternal Grandmother        dx in 14s    Social History   Socioeconomic History   Marital status: Married    Spouse name: Not on file   Number of children: Not on file   Years of education: Not on file   Highest education level: Not on file  Occupational History  Occupation: retired  Tobacco Use   Smoking status: Former    Current packs/day: 0.00    Types: Cigarettes    Quit date: 1979    Years since quitting: 46.0   Smokeless tobacco: Never  Vaping Use   Vaping status: Never Used  Substance and Sexual Activity   Alcohol use: Yes    Comment: social   Drug use: Never   Sexual activity: Yes  Other Topics Concern   Not on file  Social History Narrative   Not on file   Social Drivers of Health   Financial Resource Strain: Not on file  Food Insecurity: Not on file  Transportation Needs: Not on file  Physical Activity: Not on file  Stress: Not on file  Social Connections: Not on file    Current  Medications:  Current Outpatient Medications:    acetaminophen  (TYLENOL ) 500 MG tablet, Take 500-1,000 mg by mouth every 8 (eight) hours as needed for moderate pain or headache. , Disp: , Rfl:    ALPRAZolam (XANAX) 0.5 MG tablet, , Disp: , Rfl:    atorvastatin  (LIPITOR) 20 MG tablet, Take 20 mg by mouth daily., Disp: , Rfl:    doxycycline (DORYX) 100 MG EC tablet, Take 100 mg by mouth 2 (two) times daily as needed (rosacea flare ups). , Disp: , Rfl:    lisinopril (ZESTRIL) 20 MG tablet, Take 20 mg by mouth daily., Disp: , Rfl:    Multiple Vitamins-Minerals (PRESERVISION AREDS 2 PO), Take 1 tablet by mouth in the morning and at bedtime., Disp: , Rfl:    polyethylene glycol (MIRALAX / GLYCOLAX) 17 g packet, Take 17 g by mouth daily as needed (constipation.)., Disp: , Rfl:    Turmeric (QC TUMERIC COMPLEX PO), Take by mouth 2 (two) times daily. 800mg  in AM and 800mg  in PM, Disp: , Rfl:   Review of Systems: + ringing in ears Denies appetite changes, fevers, chills, fatigue, unexplained weight changes. Denies hearing loss, neck lumps or masses, mouth sores or voice changes. Denies cough or wheezing.  Denies shortness of breath. Denies chest pain or palpitations. Denies leg swelling. Denies abdominal distention, pain, blood in stools, constipation, diarrhea, nausea, vomiting, or early satiety. Denies pain with intercourse, dysuria, frequency, hematuria or incontinence. Denies hot flashes, pelvic pain, vaginal bleeding or vaginal discharge.   Denies joint pain, back pain or muscle pain/cramps. Denies itching, rash, or wounds. Denies dizziness, headaches, numbness or seizures. Denies swollen lymph nodes or glands, denies easy bruising or bleeding. Denies anxiety, depression, confusion, or decreased concentration.  Physical Exam: BP 136/62 Comment: manual recheck  Pulse 67   Temp 98.5 F (36.9 C) (Oral)   Resp 20   Wt 214 lb (97.1 kg)   SpO2 99%   BMI 34.39 kg/m  General: Alert, oriented,  no acute distress. HEENT: Normocephalic, atraumatic, sclera anicteric. Chest: Clear to auscultation bilaterally.  No wheezes or rhonchi. Cardiovascular: Regular rate and rhythm, no murmurs. Abdomen: soft, nontender.  Normoactive bowel sounds.  No masses or hepatosplenomegaly appreciated.  Well-healed incisions. Extremities: Grossly normal range of motion.  Warm, well perfused.  No edema bilaterally. Skin: No rashes or lesions noted. Lymphatics: No cervical, supraclavicular, or inguinal adenopathy. GU: Normal appearing external genitalia without erythema, excoriation, or lesions.  Speculum exam reveals mildly atrophic vaginal mucosa, no lesions, bleeding, or discharge.  Bimanual exam reveals cuff intact, no nodularity or masses.  Rectovaginal exam confirms these findings, no adnexal masses palpated.  Laboratory & Radiologic Studies:  Component Ref Range & Units (hover) 1 yr ago (02/26/22) 1 yr ago (08/23/21) 2 yr ago (02/20/21) 2 yr ago (08/08/20) 3 yr ago (12/24/19)  Cancer Antigen (CA) 125 11.8 9.9 CM 10.0 CM 8.3 CM 124.0 High  CM      Assessment & Plan: Bethany Simon is a 73 y.o. 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 Definitive surgery in 12/2019.    Patient is doing very well and remains NED on exam today.  She is now more than 3 years from her definitive surgery.   We will continue with visits every 6 months alternative with her OBGYN. I will see her back in 1 year. If she remains NED at that time, she will be discharged to further follow-up with her OBGYN.    CA-125, which was elevated prior to the time of her diagnosis, has been followed with each visit.  This tumor marker remains normal. We reviewed signs and symptoms that should prompt a phone call to see me sooner.  20 minutes of total time was spent for this patient encounter, including preparation, face-to-face counseling with the patient and coordination of care, and  documentation of the encounter.  Comer Dollar, MD  Division of Gynecologic Oncology  Department of Obstetrics and Gynecology  [facility]

## 2023-06-27 NOTE — Patient Instructions (Signed)
 It was good to see you today.  I do not see or feel any evidence of cancer recurrence on your exam.  I will see you for follow-up in 12 months.  Please call sometime in September or October of this year to get scheduled to see me in January 2025.  As always, if you develop any new and concerning symptoms before your next visit, please call to see me sooner.

## 2023-06-28 LAB — CA 125: Cancer Antigen (CA) 125: 11.9 U/mL (ref 0.0–38.1)

## 2023-06-30 DIAGNOSIS — I129 Hypertensive chronic kidney disease with stage 1 through stage 4 chronic kidney disease, or unspecified chronic kidney disease: Secondary | ICD-10-CM | POA: Diagnosis not present

## 2023-06-30 DIAGNOSIS — L719 Rosacea, unspecified: Secondary | ICD-10-CM | POA: Diagnosis not present

## 2023-06-30 DIAGNOSIS — Z0000 Encounter for general adult medical examination without abnormal findings: Secondary | ICD-10-CM | POA: Diagnosis not present

## 2023-06-30 DIAGNOSIS — M8588 Other specified disorders of bone density and structure, other site: Secondary | ICD-10-CM | POA: Diagnosis not present

## 2023-06-30 DIAGNOSIS — J3081 Allergic rhinitis due to animal (cat) (dog) hair and dander: Secondary | ICD-10-CM | POA: Diagnosis not present

## 2023-06-30 DIAGNOSIS — N1831 Chronic kidney disease, stage 3a: Secondary | ICD-10-CM | POA: Diagnosis not present

## 2023-06-30 DIAGNOSIS — F419 Anxiety disorder, unspecified: Secondary | ICD-10-CM | POA: Diagnosis not present

## 2023-06-30 DIAGNOSIS — I700 Atherosclerosis of aorta: Secondary | ICD-10-CM | POA: Diagnosis not present

## 2023-06-30 DIAGNOSIS — E785 Hyperlipidemia, unspecified: Secondary | ICD-10-CM | POA: Diagnosis not present

## 2023-07-01 ENCOUNTER — Other Ambulatory Visit: Payer: Self-pay | Admitting: Family Medicine

## 2023-07-01 DIAGNOSIS — M8588 Other specified disorders of bone density and structure, other site: Secondary | ICD-10-CM

## 2023-07-10 ENCOUNTER — Other Ambulatory Visit: Payer: Self-pay | Admitting: Family Medicine

## 2023-07-10 DIAGNOSIS — Z1231 Encounter for screening mammogram for malignant neoplasm of breast: Secondary | ICD-10-CM

## 2023-07-14 ENCOUNTER — Ambulatory Visit
Admission: RE | Admit: 2023-07-14 | Discharge: 2023-07-14 | Disposition: A | Discharge status: Home / Self Care | Payer: Medicare PPO | Source: Ambulatory Visit | Attending: Family Medicine | Admitting: Family Medicine

## 2023-07-14 DIAGNOSIS — N958 Other specified menopausal and perimenopausal disorders: Secondary | ICD-10-CM | POA: Diagnosis not present

## 2023-07-14 DIAGNOSIS — Z90722 Acquired absence of ovaries, bilateral: Secondary | ICD-10-CM | POA: Diagnosis not present

## 2023-07-14 DIAGNOSIS — M8588 Other specified disorders of bone density and structure, other site: Secondary | ICD-10-CM

## 2023-07-28 ENCOUNTER — Ambulatory Visit
Admission: RE | Admit: 2023-07-28 | Discharge: 2023-07-28 | Disposition: A | Discharge status: Home / Self Care | Payer: Medicare PPO | Source: Ambulatory Visit | Attending: Family Medicine | Admitting: Family Medicine

## 2023-07-28 DIAGNOSIS — Z1231 Encounter for screening mammogram for malignant neoplasm of breast: Secondary | ICD-10-CM

## 2023-08-07 DIAGNOSIS — L821 Other seborrheic keratosis: Secondary | ICD-10-CM | POA: Diagnosis not present

## 2023-08-07 DIAGNOSIS — L570 Actinic keratosis: Secondary | ICD-10-CM | POA: Diagnosis not present

## 2023-08-07 DIAGNOSIS — L814 Other melanin hyperpigmentation: Secondary | ICD-10-CM | POA: Diagnosis not present

## 2023-08-07 DIAGNOSIS — D225 Melanocytic nevi of trunk: Secondary | ICD-10-CM | POA: Diagnosis not present

## 2023-12-29 DIAGNOSIS — E785 Hyperlipidemia, unspecified: Secondary | ICD-10-CM | POA: Diagnosis not present

## 2023-12-29 DIAGNOSIS — I129 Hypertensive chronic kidney disease with stage 1 through stage 4 chronic kidney disease, or unspecified chronic kidney disease: Secondary | ICD-10-CM | POA: Diagnosis not present

## 2023-12-29 DIAGNOSIS — M25551 Pain in right hip: Secondary | ICD-10-CM | POA: Diagnosis not present

## 2023-12-29 DIAGNOSIS — K219 Gastro-esophageal reflux disease without esophagitis: Secondary | ICD-10-CM | POA: Diagnosis not present

## 2023-12-29 DIAGNOSIS — I700 Atherosclerosis of aorta: Secondary | ICD-10-CM | POA: Diagnosis not present

## 2023-12-29 DIAGNOSIS — F419 Anxiety disorder, unspecified: Secondary | ICD-10-CM | POA: Diagnosis not present

## 2023-12-29 DIAGNOSIS — N1831 Chronic kidney disease, stage 3a: Secondary | ICD-10-CM | POA: Diagnosis not present

## 2023-12-29 DIAGNOSIS — E669 Obesity, unspecified: Secondary | ICD-10-CM | POA: Diagnosis not present

## 2024-01-08 DIAGNOSIS — H04123 Dry eye syndrome of bilateral lacrimal glands: Secondary | ICD-10-CM | POA: Diagnosis not present

## 2024-01-08 DIAGNOSIS — H524 Presbyopia: Secondary | ICD-10-CM | POA: Diagnosis not present

## 2024-01-08 DIAGNOSIS — H26493 Other secondary cataract, bilateral: Secondary | ICD-10-CM | POA: Diagnosis not present

## 2024-01-08 DIAGNOSIS — Z961 Presence of intraocular lens: Secondary | ICD-10-CM | POA: Diagnosis not present
